# Patient Record
Sex: Male | Born: 1995 | Race: Black or African American | Hispanic: No | Marital: Single
Health system: Southern US, Community
[De-identification: ages and names within clinical notes are randomized; demographics above are authoritative.]

## PROBLEM LIST (undated history)

## (undated) DIAGNOSIS — B029 Zoster without complications: Secondary | ICD-10-CM

## (undated) HISTORY — DX: Zoster without complications: B02.9

---

## 2018-10-01 ENCOUNTER — Emergency Department (HOSPITAL_COMMUNITY)
Admission: EM | Admit: 2018-10-01 | Discharge: 2018-10-01 | Disposition: A | Discharge status: Home / Self Care | Payer: BC Managed Care – PPO | Attending: Emergency Medicine | Admitting: Emergency Medicine

## 2018-10-01 ENCOUNTER — Encounter (HOSPITAL_COMMUNITY): Payer: Self-pay | Admitting: Emergency Medicine

## 2018-10-01 ENCOUNTER — Other Ambulatory Visit: Payer: Self-pay

## 2018-10-01 DIAGNOSIS — B029 Zoster without complications: Secondary | ICD-10-CM | POA: Diagnosis not present

## 2018-10-01 DIAGNOSIS — R21 Rash and other nonspecific skin eruption: Secondary | ICD-10-CM | POA: Diagnosis present

## 2018-10-01 MED ORDER — ACYCLOVIR 200 MG PO CAPS
800.0000 mg | ORAL_CAPSULE | Freq: Once | ORAL | Status: AC
  Administered 2018-10-01: 22:00:00 800 mg via ORAL
  Filled 2018-10-01: qty 4

## 2018-10-01 MED ORDER — ACYCLOVIR 800 MG PO TABS: 800.0000 mg | ORAL_TABLET | Freq: Every day | ORAL | 0 refills | Status: AC

## 2018-10-01 NOTE — ED Triage Notes (Addendum)
Patient sent from [place] where he was evaluated for rash on left neck x4 days.

## 2018-10-01 NOTE — ED Provider Notes (Signed)
[facility]-EMERGENCY DEPT Provider Note   CSN: [id number] Arrival date & time: 10/01/18  1655     History   Chief Complaint Chief Complaint  Patient presents with  . Rash    HPI Tony Ford is a 23 y.o. male.   States that he noticed rash on his neck starting approximately 3 days ago.  States it has slowly been progressing, now spreading to the left side of his face.  States has not noticed anything on the right side of his neck or right side of his face.  Went to urgent care where he was told likely has shingles but should go to the emergency department for further evaluation.  Patient denies any vision changes, hearing changes, taste changes.  No fevers, sick contacts, cough.  States rash is painful, no significant itching.  Denies headaches, neck pain, neck stiffness.  Denies any known medical problems.     HPI  History reviewed. No pertinent past medical history.  There are no active problems to display for this patient.   History reviewed. No pertinent surgical history.      Home Medications    Prior to Admission medications   Medication Sig Start Date End Date Taking? Authorizing Provider  acyclovir (ZOVIRAX) 800 MG tablet Take 1 tablet (800 mg total) by mouth 5 (five) times daily for 7 days. 10/01/18 10/08/18  Milagros Lollykstra, Gearald Stonebraker S, MD    Family History No family history on file.  Social History Social History   Tobacco Use  . Smoking status: Not on file  Substance Use Topics  . Alcohol use: Not on file  . Drug use: Not on file     Allergies   Patient has no known allergies.   Review of Systems Review of Systems  Constitutional: Negative for chills and fever.  HENT: Negative for ear pain and sore throat.   Eyes: Negative for pain and visual disturbance.  Respiratory: Negative for cough and shortness of breath.   Cardiovascular: Negative for chest pain and palpitations.  Gastrointestinal: Negative for abdominal pain and  vomiting.  Genitourinary: Negative for dysuria and hematuria.  Musculoskeletal: Negative for arthralgias and back pain.  Skin: Positive for rash. Negative for color change.  Neurological: Negative for seizures and syncope.  All other systems reviewed and are negative.    Physical Exam Updated Vital Signs BP (!) 136/94   Pulse (!) 53   Temp 99.9 F (37.7 C)   Resp 18   SpO2 99%   Physical Exam Vitals signs and nursing note reviewed.  Constitutional:      Appearance: He is well-developed.  HENT:     Head: Normocephalic and atraumatic.  Eyes:     Conjunctiva/sclera: Conjunctivae normal.  Neck:     Musculoskeletal: Neck supple.  Cardiovascular:     Rate and Rhythm: Normal rate and regular rhythm.     Heart sounds: No murmur.  Pulmonary:     Effort: Pulmonary effort is normal. No respiratory distress.     Breath sounds: Normal breath sounds.  Abdominal:     Palpations: Abdomen is soft.     Tenderness: There is no abdominal tenderness.  Skin:    Comments: Scattered blistering rash across left side of neck, extending to left jaw, base of left ear; does not involve mouth, nose, eye; ear is clear without lesions  Neurological:     Mental Status: He is alert.      ED Treatments / Results  Labs (all labs ordered are  listed, but only abnormal results are displayed) Labs Reviewed  NOVEL CORONAVIRUS, NAA (HOSPITAL ORDER, SEND-OUT TO REF LAB)    EKG None  Radiology No results found.  Procedures Procedures (including critical care time)  Medications Ordered in ED Medications  acyclovir (ZOVIRAX) 200 MG capsule 800 mg (800 mg Oral Given 10/01/18 2142)     Initial Impression / Assessment and Plan / ED Course  I have reviewed the triage vital signs and the nursing notes.  Pertinent labs & imaging results that were available during my care of the patient were reviewed by me and considered in my medical decision making (see chart for details).        23 year old  gentleman presents with rash, concern for shingles based on appearance, unilateral nature.  Will start on acyclovir.  Does not involve mouth, nose, eye, does extend to the base of the ear but did not see any lesions on inside of ear.  Reviewed strict return precautions and recommended recheck here in 24 hours.  Final Clinical Impressions(s) / ED Diagnoses   Final diagnoses:  Herpes zoster without complication    ED Discharge Orders         Ordered    acyclovir (ZOVIRAX) 800 MG tablet  5 times daily     10/01/18 2112           Lucrezia Starch, MD 10/01/18 2359

## 2018-10-01 NOTE — Discharge Instructions (Addendum)
Please return to the emergency department for recheck tomorrow to monitor your rash.  If the rash is worsening, particularly if it has any involvement over your eye, mouth, please return here for reassessment sooner.  Please take the medicine as prescribed for this.

## 2019-05-18 ENCOUNTER — Ambulatory Visit: Payer: Self-pay | Admitting: Adult Health Nurse Practitioner

## 2019-05-19 ENCOUNTER — Encounter: Payer: Self-pay | Admitting: Adult Health Nurse Practitioner

## 2019-05-25 ENCOUNTER — Ambulatory Visit (INDEPENDENT_AMBULATORY_CARE_PROVIDER_SITE_OTHER): Payer: 59 | Admitting: Adult Health Nurse Practitioner

## 2019-05-25 ENCOUNTER — Other Ambulatory Visit: Payer: Self-pay

## 2019-05-25 VITALS — BP 121/74 | HR 82 | Temp 98.7°F | Ht 75.0 in | Wt 135.2 lb

## 2019-05-25 DIAGNOSIS — Z23 Encounter for immunization: Secondary | ICD-10-CM

## 2019-05-25 DIAGNOSIS — R7309 Other abnormal glucose: Secondary | ICD-10-CM

## 2019-05-25 DIAGNOSIS — E43 Unspecified severe protein-calorie malnutrition: Secondary | ICD-10-CM | POA: Diagnosis not present

## 2019-05-25 DIAGNOSIS — Z114 Encounter for screening for human immunodeficiency virus [HIV]: Secondary | ICD-10-CM

## 2019-05-25 DIAGNOSIS — Z0000 Encounter for general adult medical examination without abnormal findings: Secondary | ICD-10-CM

## 2019-05-25 LAB — POCT URINALYSIS DIP (MANUAL ENTRY)
Bilirubin, UA: NEGATIVE
Blood, UA: NEGATIVE
Glucose, UA: NEGATIVE mg/dL
Ketones, POC UA: NEGATIVE mg/dL
Leukocytes, UA: NEGATIVE
Nitrite, UA: NEGATIVE
Protein Ur, POC: NEGATIVE mg/dL
Spec Grav, UA: <=1.005 — AB (ref 1.010–1.025)
Urobilinogen, UA: 0.2 E.U./dL
pH, UA: 5 (ref 5.0–8.0)

## 2019-05-25 LAB — GLUCOSE, POCT (MANUAL RESULT ENTRY): POC Glucose: 85 mg/dl (ref 70–99)

## 2019-05-25 MED ORDER — METAMUCIL 48.57 % PO POWD: 1.0000 | Freq: Every day | ORAL | 2 refills | Status: AC

## 2019-05-25 MED ORDER — METOCLOPRAMIDE HCL 5 MG PO TABS: 5.0000 mg | ORAL_TABLET | Freq: Three times a day (TID) | ORAL | 0 refills | Status: DC

## 2019-05-25 MED ORDER — TRAZODONE HCL 50 MG PO TABS: ORAL_TABLET | ORAL | 3 refills | Status: DC

## 2019-05-25 NOTE — Patient Instructions (Signed)
° ° ° °  If you have lab work done today you will be contacted with your lab results within the next 2 weeks.  If you have not heard from us then please contact us. The fastest way to get your results is to register for My Chart. ° ° °IF you received an x-ray today, you will receive an invoice from [facility]. Please contact [facility] at [phone] with questions or concerns regarding your invoice.  ° °IF you received labwork today, you will receive an invoice from [facility]. Please contact [facility] at [phone] with questions or concerns regarding your invoice.  ° °Our billing staff will not be able to assist you with questions regarding bills from these companies. ° °You will be contacted with the lab results as soon as they are available. The fastest way to get your results is to activate your My Chart account. Instructions are located on the last page of this paperwork. If you have not heard from us regarding the results in 2 weeks, please contact this office. °  ° ° ° °

## 2019-05-26 LAB — CMP14+EGFR
ALT: 17 IU/L (ref 0–44)
AST: 22 IU/L (ref 0–40)
Albumin/Globulin Ratio: 1.6 (ref 1.2–2.2)
Albumin: 4.9 g/dL (ref 4.1–5.2)
Alkaline Phosphatase: 139 IU/L — ABNORMAL HIGH (ref 39–117)
BUN/Creatinine Ratio: 11 (ref 9–20)
BUN: 9 mg/dL (ref 6–20)
Bilirubin Total: <0.2 mg/dL (ref 0.0–1.2)
CO2: 26 mmol/L (ref 20–29)
Calcium: 9.9 mg/dL (ref 8.7–10.2)
Chloride: 98 mmol/L (ref 96–106)
Creatinine, Ser: 0.8 mg/dL (ref 0.76–1.27)
GFR calc Af Amer: 144 mL/min/{1.73_m2} (ref >59)
GFR calc non Af Amer: 124 mL/min/{1.73_m2} (ref >59)
Globulin, Total: 3 g/dL (ref 1.5–4.5)
Glucose: 89 mg/dL (ref 65–99)
Potassium: 4.2 mmol/L (ref 3.5–5.2)
Sodium: 140 mmol/L (ref 134–144)
Total Protein: 7.9 g/dL (ref 6.0–8.5)

## 2019-05-26 LAB — THYROID PANEL WITH TSH
Free Thyroxine Index: 1.7 (ref 1.2–4.9)
T3 Uptake Ratio: 30 % (ref 24–39)
T4, Total: 5.7 ug/dL (ref 4.5–12.0)
TSH: 2.43 u[IU]/mL (ref 0.450–4.500)

## 2019-05-26 LAB — VITAMIN B12: Vitamin B-12: 1055 pg/mL (ref 232–1245)

## 2019-05-26 LAB — IRON,TIBC AND FERRITIN PANEL
Ferritin: 71 ng/mL (ref 30–400)
Iron Saturation: 19 % (ref 15–55)
Iron: 66 ug/dL (ref 38–169)
Total Iron Binding Capacity: 341 ug/dL (ref 250–450)
UIBC: 275 ug/dL (ref 111–343)

## 2019-05-26 LAB — VITAMIN D 25 HYDROXY (VIT D DEFICIENCY, FRACTURES): Vit D, 25-Hydroxy: 35.5 ng/mL (ref 30.0–100.0)

## 2019-05-26 LAB — HIV ANTIBODY (ROUTINE TESTING W REFLEX): HIV Screen 4th Generation wRfx: NONREACTIVE

## 2019-06-08 ENCOUNTER — Ambulatory Visit: Payer: 59 | Admitting: Adult Health Nurse Practitioner

## 2019-06-08 ENCOUNTER — Other Ambulatory Visit: Payer: Self-pay

## 2019-06-08 MED ORDER — PEDIALYTE SINGLES PO SOLN: 1.0000 | Freq: Two times a day (BID) | ORAL | 1 refills | Status: DC

## 2019-06-08 NOTE — Patient Instructions (Signed)
° ° ° °  If you have lab work done today you will be contacted with your lab results within the next 2 weeks.  If you have not heard from us then please contact us. The fastest way to get your results is to register for My Chart. ° ° °IF you received an x-ray today, you will receive an invoice from [facility]. Please contact  Radiology at [phone] with questions or concerns regarding your invoice.  ° °IF you received labwork today, you will receive an invoice from [facility]. Please contact [facility] at [phone] with questions or concerns regarding your invoice.  ° °Our billing staff will not be able to assist you with questions regarding bills from these companies. ° °You will be contacted with the lab results as soon as they are available. The fastest way to get your results is to activate your My Chart account. Instructions are located on the last page of this paperwork. If you have not heard from us regarding the results in 2 weeks, please contact this office. °  ° ° ° °

## 2019-06-08 NOTE — Progress Notes (Signed)
History   Chief Complaint  Patient presents with  . Establish Care    Pt stated that he has chills in both hand and feet, Lt eye has been shaking, low appetite, and tingling in his big toe.    HPI    Patient reports that over the past 2 months he has had chills in both his hands and his feet and has had some tingling in his left eye and in his big toe.  Upon discussion, patient states he only has time to eat 1 meal a day.  He is not receiving much nutrition at all.  If anything, he eats some rice at night and is able to consume about 4 glasses of water.  We discussed how he is likely not getting enough nutrition.  I suspect this is at the root of his problems.  We discussed the need for an interpreter to drink 8 or more glasses a day at least and to try to eat some small meals in between, with protein.  He simply does not have enough calories to sustain normal bodily function.  No past medical history on file.  No family history on file. Social History   Tobacco Use  . Smoking status: Not on file  Substance Use Topics  . Alcohol use: Not on file  . Drug use: Not on file    Review of Systems   Review of Systems  Constitutional: Negative for activity change, appetite change, chills and fever. Feels dehydrated, dry skin + dry eyes  HENT: Negative for congestion, nosebleeds, trouble swallowing and voice change.   Respiratory: Negative for cough, shortness of breath and wheezing.   Gastrointestinal: Negative for diarrhea, nausea and vomiting.  Genitourinary: Negative for difficulty urinating, dysuria, flank pain and hematuria.  Musculoskeletal: Negative for back pain, joint swelling and neck pain.  Neurological: Negative for dizziness, speech difficulty, light-headedness and numbness.  See HPI. All other review of systems negative.    Allergies   Patient has no known allergies.  Home Medications    Current Outpatient Medications:  .  metoCLOPramide (REGLAN) 5 MG tablet,  Take 1 tablet (5 mg total) by mouth 3 (three) times daily before meals for 10 days. 30 minutes before a full meal, should not exceed 3 doses in one day, Disp: 30 tablet, Rfl: 0 .  Oral Electrolytes (PEDIALYTE SINGLES) SOLN, Take 1 packet by mouth in the morning and at bedtime., Disp: 237 mL, Rfl: 1 .  Psyllium (METAMUCIL) 48.57 % POWD, Take 1 Scoop by mouth daily. (Patient not taking: Reported on 06/08/2019), Disp: 300 g, Rfl: 2 .  traZODone (DESYREL) 50 MG tablet, 1/2 to 1tab qhs 30 min prior to bed, may increase to 1 full tab if not effective (Patient not taking: Reported on 06/08/2019), Disp: 30 tablet, Rfl: 3  Meds Ordered and Administered this Visit   Meds ordered this encounter  Medications  . metoCLOPramide (REGLAN) 5 MG tablet    Sig: Take 1 tablet (5 mg total) by mouth 3 (three) times daily before meals for 10 days. 30 minutes before a full meal, should not exceed 3 doses in one day    Dispense:  30 tablet    Refill:  0  . Psyllium (METAMUCIL) 48.57 % POWD    Sig: Take 1 Scoop by mouth daily.    Dispense:  300 g    Refill:  2  . traZODone (DESYREL) 50 MG tablet    Sig: 1/2 to 1tab qhs 30 min prior to  bed, may increase to 1 full tab if not effective    Dispense:  30 tablet    Refill:  3  . Oral Electrolytes (PEDIALYTE SINGLES) SOLN    Sig: Take 1 packet by mouth in the morning and at bedtime.    Dispense:  237 mL    Refill:  1    BP 121/74 (BP Location: Right Arm, Patient Position: Sitting, Cuff Size: Normal)   Pulse 82   Temp 98.7 F (37.1 C) (Temporal)   Ht '6\' 3"'  (1.905 m)   Wt 135 lb 3.2 oz (61.3 kg)   SpO2 94%   BMI 16.90 kg/m   Physical Exam General appearance: alert, well appearing, and in no distress, oriented to person, place, and time and anxious. Chest: clear to auscultation, no wheezes, rales or rhonchi, symmetric air entry.  CVS exam: normal rate, regular rhythm, normal S1, S2, no murmurs, rubs, clicks or gallops. Skin exam - normal coloration and turgor, no  rashes, no suspicious skin lesions noted. Mental Status: normal mood, behavior, speech, dress, motor activity, and thought processes, anxious.   MDM   1. Healthcare maintenance   2. Screening for HIV (human immunodeficiency virus)   3. Need for Tdap vaccination   4. Other abnormal glucose   5. Protein-calorie malnutrition, severe ([place])    Orders Placed This Encounter  Procedures  . HIV antibody (with reflex)  . CMP14+EGFR  . VITAMIN D 25 Hydroxy (Vit-D Deficiency, Fractures)  . Iron, TIBC and Ferritin Panel  . Thyroid Panel With TSH  . Vitamin B12  . POCT urinalysis dipstick  . POCT glucose (manual entry)    Meds ordered this encounter  Medications  . metoCLOPramide (REGLAN) 5 MG tablet    Sig: Take 1 tablet (5 mg total) by mouth 3 (three) times daily before meals for 10 days. 30 minutes before a full meal, should not exceed 3 doses in one day    Dispense:  30 tablet    Refill:  0  . Psyllium (METAMUCIL) 48.57 % POWD    Sig: Take 1 Scoop by mouth daily.    Dispense:  300 g    Refill:  2  . traZODone (DESYREL) 50 MG tablet    Sig: 1/2 to 1tab qhs 30 min prior to bed, may increase to 1 full tab if not effective    Dispense:  30 tablet    Refill:  3  . DISCONTD: Oral Electrolytes (PEDIALYTE SINGLES) SOLN    Sig: Take 1 packet by mouth in the morning and at bedtime.    Dispense:  237 mL    Refill:  1

## 2019-07-07 DIAGNOSIS — Z23 Encounter for immunization: Secondary | ICD-10-CM | POA: Insufficient documentation

## 2019-07-07 DIAGNOSIS — R7309 Other abnormal glucose: Secondary | ICD-10-CM | POA: Insufficient documentation

## 2019-07-07 DIAGNOSIS — Z114 Encounter for screening for human immunodeficiency virus [HIV]: Secondary | ICD-10-CM | POA: Insufficient documentation

## 2019-07-07 DIAGNOSIS — Z0000 Encounter for general adult medical examination without abnormal findings: Secondary | ICD-10-CM | POA: Insufficient documentation

## 2019-07-07 DIAGNOSIS — E43 Unspecified severe protein-calorie malnutrition: Secondary | ICD-10-CM | POA: Insufficient documentation

## 2019-08-20 ENCOUNTER — Encounter: Payer: Self-pay | Admitting: Family Medicine

## 2019-08-20 ENCOUNTER — Other Ambulatory Visit: Payer: Self-pay

## 2019-08-20 ENCOUNTER — Ambulatory Visit (INDEPENDENT_AMBULATORY_CARE_PROVIDER_SITE_OTHER): Payer: 59 | Admitting: Family Medicine

## 2019-08-20 VITALS — BP 131/77 | HR 53 | Temp 98.4°F | Resp 15 | Ht 75.0 in | Wt 137.8 lb

## 2019-08-20 DIAGNOSIS — G8929 Other chronic pain: Secondary | ICD-10-CM | POA: Diagnosis not present

## 2019-08-20 DIAGNOSIS — R1030 Lower abdominal pain, unspecified: Secondary | ICD-10-CM | POA: Diagnosis not present

## 2019-08-20 DIAGNOSIS — M545 Low back pain: Secondary | ICD-10-CM

## 2019-08-20 DIAGNOSIS — R350 Frequency of micturition: Secondary | ICD-10-CM | POA: Diagnosis not present

## 2019-08-20 DIAGNOSIS — R5383 Other fatigue: Secondary | ICD-10-CM

## 2019-08-20 DIAGNOSIS — R1013 Epigastric pain: Secondary | ICD-10-CM

## 2019-08-20 LAB — POCT URINALYSIS DIP (MANUAL ENTRY)
Bilirubin, UA: NEGATIVE
Blood, UA: NEGATIVE
Glucose, UA: NEGATIVE mg/dL
Ketones, POC UA: NEGATIVE mg/dL
Leukocytes, UA: NEGATIVE
Nitrite, UA: NEGATIVE
Protein Ur, POC: NEGATIVE mg/dL
Spec Grav, UA: 1.015 (ref 1.010–1.025)
Urobilinogen, UA: 0.2 E.U./dL
pH, UA: 5 (ref 5.0–8.0)

## 2019-08-20 NOTE — Progress Notes (Signed)
Patient ID: Tony Ford, male    DOB: 04-27-1995  Age: 24 y.o. MRN: [id number]  Chief Complaint  Patient presents with  . Fatigue    pt complains of abdominal pain with fatigue, pt thinks this is due to his prostate, for the past year, pt states he is having pain in testicles, and back and is having trouble urinating,     Subjective:  24 year old man who has been in the [place] for 2 years from [place].  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  He has been having problems with excessive fatigue.  His biggest concern though is low abdomen hurts him.  He thinks it is from his prostate.  He has urinary frequency.  He aches in the back and bladder.  This been going on for a long time.  Previous urinalysis was normal.  He has had some dyspepsia.  No nausea or vomiting.  He eats okay.  Bowels act normally.  Does get up in the night to urinate.  Does not feel like he is under undue stress or anxiety.  Is anxious some.  Does not need to take the sleeping medication, trazodone, that he was given in the past.  No fevers.  Current allergies, medications, problem list, past/family and social histories reviewed.  Objective:  BP 131/77   Pulse (!) 53   Temp 98.4 F (36.9 C) (Temporal)   Resp 15   Ht '6\' 3"'  (1.905 m)   Wt 137 lb 12.8 oz (62.5 kg)   SpO2 100%   BMI 17.22 kg/m   Lean young man in no acute distress.  Chest clear.  Abdomen soft without masses or specific tenderness.  He hurts in his back but he is nontender in the back.  Good range of motion of the spine.  Normal male external genitalia with testes descended.  No hernias or pain.  Digital rectal exam reveals an exceptionally small prostate gland but no tenderness or nodules. Assessment & Plan:   Assessment: 1. Lower abdominal pain   2. Urinary frequency   3. Chronic midline low back pain without sciatica   4. Dyspepsia   5. Fatigue, unspecified type       Plan: See  instructions.  I spoke to him through an interpreter on the computer.  I am not certain what exactly is going on with him, but will start with some basic work-up.  He may need to see a urologist, though I do not find anything clearly that they will do something with.  Orders Placed This Encounter  Procedures  . US Abdomen Complete    Chronic low abdomen pain with urinary frequency and back pain    Standing Status:   Future    Standing Expiration Date:   08/19/2020    Order Specific Question:   Reason for Exam (SYMPTOM  OR DIAGNOSIS REQUIRED)    Answer:   low abdomen and bladder pain    Order Specific Question:   Preferred imaging location?    Answer:   [facility]  . CBC  . CMP14+EGFR  . Sedimentation rate  . POCT urinalysis dipstick    No orders of the defined types were placed in this encounter.        Patient Instructions     You have been having your symptoms for a long time.  Your prostate is normal on exam.  I am going ahead and  getting some laboratory tests on you.  These include blood tests, a urine check, and a scan of your abdomen.  I had told you I was going to do a CT scan, but I change my mind and have decided to do an ultrasound which shows the bladder and kidneys well.  Someone will contact you and let you know when this is scheduled for.  I realize you do not speak English very well, but you need to get a friend to help you go over these with you.       .     .      .       .                        .       .                    .  For your stomach and appetite you can try taking a little over-the-counter Prilosec  (omeprazole) 1 daily 20 mg and see if that helps you.          Prilosec () 1  20       .  If you have lab work done today you will be contacted with your lab results within the next 2 weeks.  If you have not heard from [place] then please contact us. The fastest way to get your results is to register for My Chart.   IF you received an x-ray today, you will receive an invoice from [facility]. Please contact [facility] at [phone] with questions or concerns regarding your invoice.   IF you received labwork today, you will receive an invoice from [facility]. Please contact [facility] at [phone] with questions or concerns regarding your invoice.   Our billing staff will not be able to assist you with questions regarding bills from these companies.  You will be contacted with the lab results as soon as they are available. The fastest way to get your results is to activate your My Chart account. Instructions are located on the last page of this paperwork. If you have not heard from [place] regarding the results in 2 weeks, please contact this office.        Return if symptoms worsen or fail to improve.   Ruben Reason, MD 08/20/2019

## 2019-08-20 NOTE — Patient Instructions (Addendum)
   You have been having your symptoms for a long time.  Your prostate is normal on exam.  I am going ahead and getting some laboratory tests on you.  These include blood tests, a urine check, and a scan of your abdomen.  I had told you I was going to do a CT scan, but I change my mind and have decided to do an ultrasound which shows the bladder and kidneys well.  Someone will contact you and let you know when this is scheduled for.  I realize you do not speak English very well, but you need to get a friend to help you go over these with you.       .     .      .       .                        .       .                    .  For your stomach and appetite you can try taking a little over-the-counter Prilosec (omeprazole) 1 daily 20 mg and see if that helps you.          Prilosec () 1  20       .  If you have lab work done today you will be contacted with your lab results within the next 2 weeks.  If you have not heard from [place] then please contact us. The fastest way to get your results is to register for My Chart.   IF you received an x-ray today, you will receive an invoice from [facility]. Please contact [facility] at [phone] with questions or concerns regarding your invoice.   IF you received labwork today, you will receive an invoice from [facility]. Please contact [facility] at [phone] with questions or concerns regarding your invoice.   Our billing staff will not be able to assist you with questions regarding bills from these  companies.  You will be contacted with the lab results as soon as they are available. The fastest way to get your results is to activate your My Chart account. Instructions are located on the last page of this paperwork. If you have not heard from [place] regarding the results in 2 weeks, please contact this office.

## 2019-08-21 LAB — CMP14+EGFR
ALT: 22 IU/L (ref 0–44)
AST: 22 IU/L (ref 0–40)
Albumin/Globulin Ratio: 1.8 (ref 1.2–2.2)
Albumin: 5.1 g/dL (ref 4.1–5.2)
Alkaline Phosphatase: 132 IU/L — ABNORMAL HIGH (ref 48–121)
BUN/Creatinine Ratio: 13 (ref 9–20)
BUN: 10 mg/dL (ref 6–20)
Bilirubin Total: 0.3 mg/dL (ref 0.0–1.2)
CO2: 28 mmol/L (ref 20–29)
Calcium: 10.9 mg/dL — ABNORMAL HIGH (ref 8.7–10.2)
Chloride: 102 mmol/L (ref 96–106)
Creatinine, Ser: 0.76 mg/dL (ref 0.76–1.27)
GFR calc Af Amer: 147 mL/min/{1.73_m2} (ref >59)
GFR calc non Af Amer: 127 mL/min/{1.73_m2} (ref >59)
Globulin, Total: 2.9 g/dL (ref 1.5–4.5)
Glucose: 106 mg/dL — ABNORMAL HIGH (ref 65–99)
Potassium: 4.9 mmol/L (ref 3.5–5.2)
Sodium: 142 mmol/L (ref 134–144)
Total Protein: 8 g/dL (ref 6.0–8.5)

## 2019-08-21 LAB — SEDIMENTATION RATE: Sed Rate: 4 mm/hr (ref 0–15)

## 2019-08-21 LAB — CBC
Hematocrit: 49.9 % (ref 37.5–51.0)
Hemoglobin: 16.6 g/dL (ref 13.0–17.7)
MCH: 30.8 pg (ref 26.6–33.0)
MCHC: 33.3 g/dL (ref 31.5–35.7)
MCV: 93 fL (ref 79–97)
Platelets: 270 10*3/uL (ref 150–450)
RBC: 5.39 x10E6/uL (ref 4.14–5.80)
RDW: 12.5 % (ref 11.6–15.4)
WBC: 3.7 10*3/uL (ref 3.4–10.8)

## 2019-08-31 ENCOUNTER — Ambulatory Visit
Admission: RE | Admit: 2019-08-31 | Discharge: 2019-08-31 | Disposition: A | Discharge status: Home / Self Care | Payer: 59 | Source: Ambulatory Visit | Attending: Family Medicine | Admitting: Family Medicine

## 2019-08-31 DIAGNOSIS — R350 Frequency of micturition: Secondary | ICD-10-CM

## 2019-08-31 DIAGNOSIS — R1030 Lower abdominal pain, unspecified: Secondary | ICD-10-CM

## 2019-08-31 DIAGNOSIS — M545 Low back pain, unspecified: Secondary | ICD-10-CM

## 2019-09-01 ENCOUNTER — Other Ambulatory Visit: Payer: BC Managed Care – PPO

## 2019-09-09 ENCOUNTER — Ambulatory Visit (INDEPENDENT_AMBULATORY_CARE_PROVIDER_SITE_OTHER): Payer: 59 | Admitting: Family Medicine

## 2019-09-09 ENCOUNTER — Other Ambulatory Visit: Payer: Self-pay

## 2019-09-09 ENCOUNTER — Encounter: Payer: Self-pay | Admitting: Family Medicine

## 2019-09-09 NOTE — Progress Notes (Signed)
Patient ID: Tony Ford, male    DOB: Dec 17, 1995  Age: 24 y.o. MRN: [id number]  Chief Complaint  Patient presents with  . Abdominal Pain    pt has had an [place] and has been felling okay since no concerns at this time     Subjective:   Here for follow-up of his labs.  Reviewed them with him. Current allergies, medications, problem list, past/family and social histories reviewed.  Objective:  BP 132/74   Pulse (!) 50   Temp 98.6 F (37 C) (Temporal)   Resp 15   Ht 6' 3" (1.905 m)   Wt 138 lb 12.8 oz (63 kg)   SpO2 100%   BMI 17.35 kg/m   No thyromegaly.  He felt something in the right side of the neck but I think it is just the hyoid bone.  Assessment & Plan:   Assessment: 1. Serum calcium elevated       Plan: See instructions  Orders Placed This Encounter  Procedures  . Calcium    No orders of the defined types were placed in this encounter.        Patient Instructions    The ultrasound was entirely normal.   CLINICAL DATA:  Lower abdominal pain and urinary frequency  EXAM: ABDOMEN ULTRASOUND COMPLETE  COMPARISON:  None.  FINDINGS: Gallbladder: No gallstones or wall thickening visualized. No sonographic Murphy sign noted by sonographer.  Common bile duct: Diameter: 2 mm  Liver: No focal lesion identified. Within normal limits in parenchymal echogenicity. Portal vein is patent on color Doppler imaging with normal direction of blood flow towards the liver.  IVC: No abnormality visualized.  Pancreas: Visualized portion unremarkable.  Spleen: Size and appearance within normal limits.  Right Kidney: Length: 11.4 cm. Echogenicity within normal limits. No mass or hydronephrosis visualized.  Left Kidney: Length: 11.1 cm. Echogenicity within normal limits. No mass or hydronephrosis visualized.  Abdominal aorta: No aneurysm visualized.  Other findings: None.  IMPRESSION: Normal examination   The blood work was also very  good, and the only thing I am a little bit concerned about is that your calcium level was high.  I am repeating that.  If it stays high there is another test we will need to do.  I encourage you to drink more water.     Ref Range & Units 2 wk ago 3 mo ago  Glucose 65 - 99 mg/dL 106High  89   BUN 6 - 20 mg/dL 10  9   Creatinine, Ser 0.76 - 1.27 mg/dL 0.76  0.80   GFR calc non Af Amer >59 mL/min/1.73 127  124   GFR calc Af Amer >59 mL/min/1.73 147  144   Comment: **[facility] currently reports eGFR in compliance with the current**   recommendations of the [employer]. [facility] will   update reporting as new guidelines are published from the NKF-ASN   Task force.   BUN/Creatinine Ratio 9 - _0 Sodium 134 - 144 mmol/L 142  140   Potassium 3.5 - 5.2 mmol/L 4.9  4.2   Chloride 96 - 106 mmol/L 102  98   CO2 20 - 29 mmol/L 28  26   Calcium 8.7 - 10.2 mg/dL 10.9High  9.9   Total Protein 6.0 - 8.5 g/dL 8.0  7.9   Albumin 4.1 - 5.2 g/dL 5.1  4.9   Globulin, Total 1.5 - 4.5 g/dL 2.9  3.0   Albumin/Globulin Ratio 1.2 - 2.2 1.8  1.6   Bilirubin Total 0.0 - 1.2 mg/dL 0.3  <0.2   Alkaline Phosphatase 48 - 121 IU/L 132High  139High R   AST 0 - 40 IU/L 22  22   ALT 0 - 44 IU/L 22  17     Ref Range & Units 2 wk ago  WBC 3.4 - 10.8 x10E3/uL 3.7   RBC 4.14 - 5.80 x10E6/uL 5.39   Hemoglobin 13.0 - 17.7 g/dL 16.6   Hematocrit 37.5 - 51.0 % 49.9   MCV 79 - 97 fL 93   MCH 26.6 - 33.0 pg 30.8   MCHC 31 - 35 g/dL 33.3   RDW 11.6 - 15.4 % 12.5   Platelets 150 - 450 x10E3/uL 270     If you have lab work done today you will be contacted with your lab results within the next 2 weeks.  If you have not heard from [place] then please contact us. The fastest way to get your results is to register for My Chart.   IF you received an x-ray today, you will receive an invoice from [facility]. Please contact [facility] at [phone] with questions or concerns regarding  your invoice.   IF you received labwork today, you will receive an invoice from [facility]. Please contact [facility] at [phone] with questions or concerns regarding your invoice.   Our billing staff will not be able to assist you with questions regarding bills from these companies.  You will be contacted with the lab results as soon as they are available. The fastest way to get your results is to activate your My Chart account. Instructions are located on the last page of this paperwork. If you have not heard from [place] regarding the results in 2 weeks, please contact this office.        No follow-ups on file.   Ruben Reason, MD 09/09/2019

## 2019-09-09 NOTE — Patient Instructions (Addendum)
The ultrasound was entirely normal.   CLINICAL DATA:  Lower abdominal pain and urinary frequency  EXAM: ABDOMEN ULTRASOUND COMPLETE  COMPARISON:  None.  FINDINGS: Gallbladder: No gallstones or wall thickening visualized. No sonographic Murphy sign noted by sonographer.  Common bile duct: Diameter: 2 mm  Liver: No focal lesion identified. Within normal limits in parenchymal echogenicity. Portal vein is patent on color Doppler imaging with normal direction of blood flow towards the liver.  IVC: No abnormality visualized.  Pancreas: Visualized portion unremarkable.  Spleen: Size and appearance within normal limits.  Right Kidney: Length: 11.4 cm. Echogenicity within normal limits. No mass or hydronephrosis visualized.  Left Kidney: Length: 11.1 cm. Echogenicity within normal limits. No mass or hydronephrosis visualized.  Abdominal aorta: No aneurysm visualized.  Other findings: None.  IMPRESSION: Normal examination   The blood work was also very good, and the only thing I am a little bit concerned about is that your calcium level was high.  I am repeating that.  If it stays high there is another test we will need to do.  I encourage you to drink more water.     Ref Range & Units 2 wk ago 3 mo ago  Glucose 65 - 99 mg/dL 106High  89   BUN 6 - 20 mg/dL 10  9   Creatinine, Ser 0.76 - 1.27 mg/dL 0.76  0.80   GFR calc non Af Amer >59 mL/min/1.73 127  124   GFR calc Af Amer >59 mL/min/1.73 147  144   Comment: **[facility] currently reports eGFR in compliance with the current**   recommendations of the [employer]. [facility] will   update reporting as new guidelines are published from the NK[employer]-ASN   Task force.   BUN/Creatinine Ratio 9 - '20 13  11   ' Sodium 134 - 144 mmol/L 142  140   Potassium 3.5 - 5.2 mmol/L 4.9  4.2   Chloride 96 - 106 mmol/L 102  98   CO2 20 - 29 mmol/L 28  26   Calcium 8.7 - 10.2 mg/dL 10.9High  9.9   Total Protein  6.0 - 8.5 g/dL 8.0  7.9   Albumin 4.1 - 5.2 g/dL 5.1  4.9   Globulin, Total 1.5 - 4.5 g/dL 2.9  3.0   Albumin/Globulin Ratio 1.2 - 2.2 1.8  1.6   Bilirubin Total 0.0 - 1.2 mg/dL 0.3  <0.2   Alkaline Phosphatase 48 - 121 IU/L 132High  139High R   AST 0 - 40 IU/L 22  22   ALT 0 - 44 IU/L 22  17     Ref Range & Units 2 wk ago  WBC 3.4 - 10.8 x10E3/uL 3.7   RBC 4.14 - 5.80 x10E6/uL 5.39   Hemoglobin 13.0 - 17.7 g/dL 16.6   Hematocrit 37.5 - 51.0 % 49.9   MCV 79 - 97 fL 93   MCH 26.6 - 33.0 pg 30.8   MCHC 31 - 35 g/dL 33.3   RDW 11.6 - 15.4 % 12.5   Platelets 150 - 450 x10E3/uL 270     If you have lab work done today you will be contacted with your lab results within the next 2 weeks.  If you have not heard from [place] then please contact us. The fastest way to get your results is to register for My Chart.   IF you received an x-ray today, you will receive an invoice from [facility]. Please contact [facility] at [phone] with questions or  concerns regarding your invoice.   IF you received labwork today, you will receive an invoice from [facility]. Please contact [facility] at [phone] with questions or concerns regarding your invoice.   Our billing staff will not be able to assist you with questions regarding bills from these companies.  You will be contacted with the lab results as soon as they are available. The fastest way to get your results is to activate your My Chart account. Instructions are located on the last page of this paperwork. If you have not heard from [place] regarding the results in 2 weeks, please contact this office.

## 2019-09-10 LAB — CALCIUM: Calcium: 10.1 mg/dL (ref 8.7–10.2)

## 2019-11-26 ENCOUNTER — Telehealth: Payer: Self-pay | Admitting: General Practice

## 2019-11-26 NOTE — Telephone Encounter (Signed)
LVMTCB to have pt sch an appt

## 2019-11-26 NOTE — Telephone Encounter (Signed)
Patient is calling to schedule appt. Please advise CB- [phone]

## 2019-12-15 ENCOUNTER — Encounter: Payer: Self-pay | Admitting: Registered Nurse

## 2019-12-15 ENCOUNTER — Ambulatory Visit (INDEPENDENT_AMBULATORY_CARE_PROVIDER_SITE_OTHER): Payer: 59 | Admitting: Registered Nurse

## 2019-12-15 ENCOUNTER — Other Ambulatory Visit: Payer: Self-pay

## 2019-12-15 DIAGNOSIS — E43 Unspecified severe protein-calorie malnutrition: Secondary | ICD-10-CM

## 2019-12-15 DIAGNOSIS — R5383 Other fatigue: Secondary | ICD-10-CM | POA: Diagnosis not present

## 2019-12-15 DIAGNOSIS — R748 Abnormal levels of other serum enzymes: Secondary | ICD-10-CM

## 2019-12-15 DIAGNOSIS — Z114 Encounter for screening for human immunodeficiency virus [HIV]: Secondary | ICD-10-CM

## 2019-12-15 DIAGNOSIS — R1030 Lower abdominal pain, unspecified: Secondary | ICD-10-CM | POA: Diagnosis not present

## 2019-12-15 DIAGNOSIS — B0229 Other postherpetic nervous system involvement: Secondary | ICD-10-CM

## 2019-12-15 MED ORDER — GABAPENTIN 100 MG PO CAPS: 100.0000 mg | ORAL_CAPSULE | Freq: Three times a day (TID) | ORAL | 3 refills | Status: AC

## 2019-12-15 NOTE — Progress Notes (Signed)
Established Patient Office Visit  Subjective:  Patient ID: Tony Ford, male    DOB: 01-16-1996  Age: 24 y.o. MRN: [id number]  CC:  Chief Complaint  Patient presents with  . Transitions Of Care    PAtient is here for TOC. Patient states that he feels like his whole body has been going numb and feeling cold for 3-4 months. Per patient it happens everyday and mostly when urinating or making a bowel movent. Also after eating he gets so fatigue and passes alot of gas.   video interpreters: [phone]  HPI Tony Ford presents for [facility] Seen [facility] recently  Had been worked up for current complaint: Feeling cold, numb. Mostly when passing urine or bowel movement No loc or dizziness After eating very fatigued and bloated, a lot of gas Some epigastric pain Worse when lying down No association with particular foods No association with exercise habits or stress  In the past [facility] had suspected malnutrition had played substantial role - per patient usually only one meal each day, he is unclear on whether or not the symptoms prevent him from eating / make him hesitant to eat or if this is a separate concern  Past Medical History:  Diagnosis Date  . Shingles     History reviewed. No pertinent surgical history.  History reviewed. No pertinent family history.  Social History   Socioeconomic History  . Marital status: Single    Spouse name: Not on file  . Number of children: Not on file  . Years of education: Not on file  . Highest education level: Not on file  Occupational History  . Not on file  Tobacco Use  . Smoking status: Never Smoker  . Smokeless tobacco: Never Used  Substance and Sexual Activity  . Alcohol use: Never  . Drug use: Never  . Sexual activity: Not Currently  Other Topics Concern  . Not on file  Social History Narrative  . Not on file   Social Determinants of Health   Financial Resource Strain: Not on file  Food Insecurity:  Not on file  Transportation Needs: Not on file  Physical Activity: Not on file  Stress: Not on file  Social Connections: Not on file  Intimate Partner Violence: Not on file    Outpatient Medications Prior to Visit  Medication Sig Dispense Refill  . metoCLOPramide (REGLAN) 5 MG tablet Take 1 tablet (5 mg total) by mouth 3 (three) times daily before meals for 10 days. 30 minutes before a full meal, should not exceed 3 doses in one day 30 tablet 0  . omeprazole (PRILOSEC) 20 MG capsule Take 20 mg by mouth daily. (Patient not taking: Reported on 12/15/2019)    . traZODone (DESYREL) 50 MG tablet 1/2 to 1tab qhs 30 min prior to bed, may increase to 1 full tab if not effective (Patient not taking: Reported on 09/09/2019) 30 tablet 3   No facility-administered medications prior to visit.    No Known Allergies  ROS Review of Systems  Constitutional: Positive for chills and fatigue.  HENT: Negative.   Eyes: Negative.   Respiratory: Negative.   Cardiovascular: Negative.   Gastrointestinal: Negative.   Genitourinary: Negative.   Musculoskeletal: Negative.   Skin: Negative.   Neurological: Negative.   Psychiatric/Behavioral: Negative.   All other systems reviewed and are negative.     Objective:    Physical Exam Constitutional:      General: He is not in acute distress.  Appearance: Normal appearance. He is normal weight. He is not ill-appearing, toxic-appearing or diaphoretic.  Cardiovascular:     Rate and Rhythm: Normal rate and regular rhythm.     Heart sounds: Normal heart sounds. No murmur heard. No friction rub. No gallop.   Pulmonary:     Effort: Pulmonary effort is normal. No respiratory distress.     Breath sounds: Normal breath sounds. No stridor. No wheezing, rhonchi or rales.  Chest:     Chest wall: No tenderness.  Abdominal:     General: Abdomen is flat. Bowel sounds are normal. There is no distension.     Palpations: Abdomen is soft. There is no mass.      Tenderness: There is abdominal tenderness (mild epigastric). There is no right CVA tenderness, left CVA tenderness, guarding or rebound.     Hernia: No hernia is present.  Neurological:     General: No focal deficit present.     Mental Status: He is alert and oriented to person, place, and time. Mental status is at baseline.  Psychiatric:        Mood and Affect: Mood normal.        Behavior: Behavior normal.        Thought Content: Thought content normal.        Judgment: Judgment normal.     BP 135/81   Pulse 62   Temp 98 F (36.7 C) (Temporal)   Resp 18   Ht 6\' 3"  (1.905 m)   Wt 136 lb (61.7 kg)   SpO2 100%   BMI 17.00 kg/m  Wt Readings from Last 3 Encounters:  01/10/20 134 lb 6.4 oz (61 kg)  12/15/19 136 lb (61.7 kg)  09/09/19 138 lb 12.8 oz (63 kg)     Health Maintenance Due  Topic Date Due  . COVID-19 Vaccine (1) Never done    There are no preventive care reminders to display for this patient.  Lab Results  Component Value Date   TSH 2.440 12/15/2019   Lab Results  Component Value Date   WBC 4.2 12/15/2019   HGB 16.1 12/15/2019   HCT 48.0 12/15/2019   MCV 91 12/15/2019   PLT 270 08/20/2019   Lab Results  Component Value Date   NA 139 12/15/2019   K 4.1 12/15/2019   CO2 27 12/15/2019   GLUCOSE 92 12/15/2019   BUN 9 12/15/2019   CREATININE 0.89 12/15/2019   BILITOT 0.4 12/15/2019   ALKPHOS 116 12/15/2019   AST 17 12/15/2019   ALT 13 12/15/2019   PROT 7.6 12/15/2019   ALBUMIN 5.0 12/15/2019   CALCIUM 10.2 12/15/2019   No results found for: CHOL No results found for: HDL No results found for: LDLCALC No results found for: TRIG No results found for: CHOLHDL Lab Results  Component Value Date   HGBA1C 5.4 12/15/2019      Assessment & Plan:   Problem List Items Addressed This Visit      Other   Screening for HIV (human immunodeficiency virus)   Relevant Orders   HIV antibody (with reflex) (Completed)   Protein-calorie malnutrition,  severe (HCC)   Relevant Orders   ANA w/Reflex (Completed)    Other Visit Diagnoses    Serum calcium elevated    -  Primary   Relevant Orders   ANA w/Reflex (Completed)   PTH, Intact and Calcium (Completed)   Lower abdominal pain       Relevant Orders   ANA w/Reflex (Completed)  C-reactive protein (Completed)   Sedimentation Rate (Completed)   H Pylori, IGM, IGG, IGA AB (Completed)   Fatigue, unspecified type       Relevant Orders   ANA w/Reflex (Completed)   CBC With Differential (Completed)   Thyroid Panel With TSH (Completed)   Vitamin D, 25-hydroxy (Completed)   Vitamin B12 (Completed)   Hemoglobin A1c (Completed)   C-reactive protein (Completed)   Sedimentation Rate (Completed)   Urinalysis, Routine w reflex microscopic   B. Burgdorfi Antibodies (Completed)   Elevated liver enzymes       Relevant Orders   ANA w/Reflex (Completed)   Comprehensive metabolic panel (Completed)   Hepatitis panel, acute (Completed)   Post herpetic neuralgia       Relevant Medications   gabapentin (NEURONTIN) 100 MG capsule      Meds ordered this encounter  Medications  . gabapentin (NEURONTIN) 100 MG capsule    Sig: Take 1 capsule (100 mg total) by mouth 3 (three) times daily.    Dispense:  90 capsule    Refill:  3    Order Specific Question:   Supervising Provider    Answer:   Neva Seat, JEFFREY R [[id number]]    Follow-up: No follow-ups on file.   PLAN  Labs collected. Will follow up with the patient as warranted.  Likely an aspect of malnutrition but want to ensure there are no chronic GI issues happening contributing to a malabsorption  Gabapentin 100mg  PO tid for relief in case of neuro issue -   Close follow up   Patient encouraged to call clinic with any questions, comments, or concerns.  , NP

## 2019-12-15 NOTE — Patient Instructions (Signed)
° ° ° °  If you have lab work done today you will be contacted with your lab results within the next 2 weeks.  If you have not heard from us then please contact us. The fastest way to get your results is to register for My Chart. ° ° °IF you received an x-ray today, you will receive an invoice from [facility]. Please contact [facility] at [phone] with questions or concerns regarding your invoice.  ° °IF you received labwork today, you will receive an invoice from [facility]. Please contact [facility] at [phone] with questions or concerns regarding your invoice.  ° °Our billing staff will not be able to assist you with questions regarding bills from these companies. ° °You will be contacted with the lab results as soon as they are available. The fastest way to get your results is to activate your My Chart account. Instructions are located on the last page of this paperwork. If you have not heard from us regarding the results in 2 weeks, please contact this office. °  ° ° ° °

## 2019-12-17 LAB — CBC WITH DIFFERENTIAL
Basophils Absolute: 0.1 10*3/uL (ref 0.0–0.2)
Basos: 1 %
EOS (ABSOLUTE): 0.2 10*3/uL (ref 0.0–0.4)
Eos: 5 %
Hematocrit: 48 % (ref 37.5–51.0)
Hemoglobin: 16.1 g/dL (ref 13.0–17.7)
Immature Grans (Abs): 0 10*3/uL (ref 0.0–0.1)
Immature Granulocytes: 0 %
Lymphocytes Absolute: 2.2 10*3/uL (ref 0.7–3.1)
Lymphs: 52 %
MCH: 30.6 pg (ref 26.6–33.0)
MCHC: 33.5 g/dL (ref 31.5–35.7)
MCV: 91 fL (ref 79–97)
Monocytes Absolute: 0.5 10*3/uL (ref 0.1–0.9)
Monocytes: 12 %
Neutrophils Absolute: 1.3 10*3/uL — ABNORMAL LOW (ref 1.4–7.0)
Neutrophils: 30 %
RBC: 5.26 x10E6/uL (ref 4.14–5.80)
RDW: 12.7 % (ref 11.6–15.4)
WBC: 4.2 10*3/uL (ref 3.4–10.8)

## 2019-12-17 LAB — COMPREHENSIVE METABOLIC PANEL
ALT: 13 IU/L (ref 0–44)
AST: 17 IU/L (ref 0–40)
Albumin/Globulin Ratio: 1.9 (ref 1.2–2.2)
Albumin: 5 g/dL (ref 4.1–5.2)
Alkaline Phosphatase: 116 IU/L (ref 44–121)
BUN/Creatinine Ratio: 10 (ref 9–20)
BUN: 9 mg/dL (ref 6–20)
Bilirubin Total: 0.4 mg/dL (ref 0.0–1.2)
CO2: 27 mmol/L (ref 20–29)
Calcium: 10.2 mg/dL (ref 8.7–10.2)
Chloride: 98 mmol/L (ref 96–106)
Creatinine, Ser: 0.89 mg/dL (ref 0.76–1.27)
GFR calc Af Amer: 136 mL/min/{1.73_m2} (ref >59)
GFR calc non Af Amer: 118 mL/min/{1.73_m2} (ref >59)
Globulin, Total: 2.6 g/dL (ref 1.5–4.5)
Glucose: 92 mg/dL (ref 65–99)
Potassium: 4.1 mmol/L (ref 3.5–5.2)
Sodium: 139 mmol/L (ref 134–144)
Total Protein: 7.6 g/dL (ref 6.0–8.5)

## 2019-12-17 LAB — PTH, INTACT AND CALCIUM: PTH: 18 pg/mL (ref 15–65)

## 2019-12-17 LAB — ANA W/REFLEX: Anti Nuclear Antibody (ANA): NEGATIVE

## 2019-12-17 LAB — THYROID PANEL WITH TSH
Free Thyroxine Index: 1.7 (ref 1.2–4.9)
T3 Uptake Ratio: 29 % (ref 24–39)
T4, Total: 5.8 ug/dL (ref 4.5–12.0)
TSH: 2.44 u[IU]/mL (ref 0.450–4.500)

## 2019-12-17 LAB — SEDIMENTATION RATE: Sed Rate: 2 mm/hr (ref 0–15)

## 2019-12-17 LAB — HEPATITIS PANEL, ACUTE
Hep A IgM: NEGATIVE
Hep B C IgM: NEGATIVE
Hep C Virus Ab: <0.1 s/co ratio (ref 0.0–0.9)
Hepatitis B Surface Ag: NEGATIVE

## 2019-12-17 LAB — VITAMIN D 25 HYDROXY (VIT D DEFICIENCY, FRACTURES): Vit D, 25-Hydroxy: 29 ng/mL — ABNORMAL LOW (ref 30.0–100.0)

## 2019-12-17 LAB — H PYLORI, IGM, IGG, IGA AB
H pylori, IgM Abs: <9 units (ref 0.0–8.9)
H. pylori, IgA Abs: 20.2 units — ABNORMAL HIGH (ref 0.0–8.9)
H. pylori, IgG AbS: 2.8 Index Value — ABNORMAL HIGH (ref 0.00–0.79)

## 2019-12-17 LAB — C-REACTIVE PROTEIN: CRP: <1 mg/L (ref 0–10)

## 2019-12-17 LAB — HEMOGLOBIN A1C
Est. average glucose Bld gHb Est-mCnc: 108 mg/dL
Hgb A1c MFr Bld: 5.4 % (ref 4.8–5.6)

## 2019-12-17 LAB — VITAMIN B12: Vitamin B-12: 1195 pg/mL (ref 232–1245)

## 2019-12-17 LAB — HIV ANTIBODY (ROUTINE TESTING W REFLEX): HIV Screen 4th Generation wRfx: NONREACTIVE

## 2019-12-17 LAB — B. BURGDORFI ANTIBODIES: Lyme IgG/IgM Ab: <0.91 {ISR} (ref 0.00–0.90)

## 2019-12-20 ENCOUNTER — Other Ambulatory Visit: Payer: Self-pay | Admitting: Registered Nurse

## 2019-12-20 DIAGNOSIS — A048 Other specified bacterial intestinal infections: Secondary | ICD-10-CM

## 2019-12-20 MED ORDER — AMOXICILLIN 500 MG PO CAPS: 1000.0000 mg | ORAL_CAPSULE | Freq: Two times a day (BID) | ORAL | 0 refills | Status: DC

## 2019-12-20 MED ORDER — CLARITHROMYCIN 500 MG PO TABS: 500.0000 mg | ORAL_TABLET | Freq: Two times a day (BID) | ORAL | 0 refills | Status: DC

## 2019-12-20 MED ORDER — PANTOPRAZOLE SODIUM 40 MG PO TBEC: 40.0000 mg | DELAYED_RELEASE_TABLET | Freq: Every day | ORAL | 3 refills | Status: AC

## 2019-12-20 NOTE — Progress Notes (Signed)
If we could call patient - his serology shows positive for H. Pylori. We need to treat as follows: Clarithromycin 500mg  PO bid for 14 days Amoxicillin 1g PO bid for 14 days Pantoprazole 40mg  PO qd for 14 days  We can see him in about 3-4 weeks to check on his symptoms  Thank you  , NP

## 2020-01-10 ENCOUNTER — Ambulatory Visit (INDEPENDENT_AMBULATORY_CARE_PROVIDER_SITE_OTHER): Payer: 59

## 2020-01-10 ENCOUNTER — Other Ambulatory Visit: Payer: Self-pay

## 2020-01-10 ENCOUNTER — Ambulatory Visit (INDEPENDENT_AMBULATORY_CARE_PROVIDER_SITE_OTHER): Payer: 59 | Admitting: Registered Nurse

## 2020-01-10 ENCOUNTER — Encounter: Payer: Self-pay | Admitting: Registered Nurse

## 2020-01-10 VITALS — BP 127/80 | HR 54 | Temp 97.7°F | Ht 74.0 in | Wt 134.4 lb

## 2020-01-10 DIAGNOSIS — M25551 Pain in right hip: Secondary | ICD-10-CM

## 2020-01-10 DIAGNOSIS — M545 Low back pain, unspecified: Secondary | ICD-10-CM | POA: Diagnosis not present

## 2020-01-10 DIAGNOSIS — A048 Other specified bacterial intestinal infections: Secondary | ICD-10-CM

## 2020-01-10 DIAGNOSIS — M25552 Pain in left hip: Secondary | ICD-10-CM

## 2020-01-10 DIAGNOSIS — E43 Unspecified severe protein-calorie malnutrition: Secondary | ICD-10-CM

## 2020-01-10 DIAGNOSIS — R1030 Lower abdominal pain, unspecified: Secondary | ICD-10-CM

## 2020-01-10 DIAGNOSIS — R3915 Urgency of urination: Secondary | ICD-10-CM

## 2020-01-10 DIAGNOSIS — R634 Abnormal weight loss: Secondary | ICD-10-CM

## 2020-01-10 LAB — URINALYSIS, ROUTINE W REFLEX MICROSCOPIC
Bilirubin, UA: NEGATIVE
Glucose, UA: NEGATIVE
Ketones, UA: NEGATIVE
Leukocytes,UA: NEGATIVE
Nitrite, UA: NEGATIVE
Protein,UA: NEGATIVE
RBC, UA: NEGATIVE
Specific Gravity, UA: 1.015 (ref 1.005–1.030)
Urobilinogen, Ur: 0.2 mg/dL (ref 0.2–1.0)
pH, UA: 6 (ref 5.0–7.5)

## 2020-01-10 NOTE — Patient Instructions (Signed)
° ° ° °  If you have lab work done today you will be contacted with your lab results within the next 2 weeks.  If you have not heard from us then please contact us. The fastest way to get your results is to register for My Chart. ° ° °IF you received an x-ray today, you will receive an invoice from [facility]. Please contact [facility] at [phone] with questions or concerns regarding your invoice.  ° °IF you received labwork today, you will receive an invoice from [facility]. Please contact [facility] at [phone] with questions or concerns regarding your invoice.  ° °Our billing staff will not be able to assist you with questions regarding bills from these companies. ° °You will be contacted with the lab results as soon as they are available. The fastest way to get your results is to activate your My Chart account. Instructions are located on the last page of this paperwork. If you have not heard from us regarding the results in 2 weeks, please contact this office. °  ° ° ° °

## 2020-01-10 NOTE — Progress Notes (Signed)
Established Patient Office Visit  Subjective:  Patient ID: Tony Ford, male    DOB: January 16, 1996  Age: 24 y.o. MRN: [id number]  CC:  Chief Complaint  Patient presents with  . upset stomach   . Back Pain    lower area. all going on a year     HPI Tony Ford presents for follow up   Has ongoing symptoms of nausea, lower back pain, hip pain R>L, and coldness in hands and feet. Today reports that he has been experiencing urinary and fecal urgency nearing the point of incontinence for some time as well.  He denies diarrhea and vomiting. No recent change to diet or exercise. Does note that exercise worsens his symptoms of nausea and pain Pain does not wake him at night Does have some nocturia Sleep is not restful - has been told he snores but denies waking with headache or dry mouth, waking up gasping, and witnessed episodes of apnea.  Reports diet is average Pain feels msk through hip wrapping around to back Firmly denies hx of acute injury  Past Medical History:  Diagnosis Date  . Shingles     No past surgical history on file.  No family history on file.  Social History   Socioeconomic History  . Marital status: Single    Spouse name: Not on file  . Number of children: Not on file  . Years of education: Not on file  . Highest education level: Not on file  Occupational History  . Not on file  Tobacco Use  . Smoking status: Never Smoker  . Smokeless tobacco: Never Used  Substance and Sexual Activity  . Alcohol use: Never  . Drug use: Never  . Sexual activity: Not Currently  Other Topics Concern  . Not on file  Social History Narrative  . Not on file   Social Determinants of Health   Financial Resource Strain:   . Difficulty of Paying Living Expenses: Not on file  Food Insecurity:   . Worried About Programme researcher, broadcasting/film/video in the Last Year: Not on file  . Ran Out of Food in the Last Year: Not on file  Transportation Needs:   . Lack of  Transportation (Medical): Not on file  . Lack of Transportation (Non-Medical): Not on file  Physical Activity:   . Days of Exercise per Week: Not on file  . Minutes of Exercise per Session: Not on file  Stress:   . Feeling of Stress : Not on file  Social Connections:   . Frequency of Communication with Friends and Family: Not on file  . Frequency of Social Gatherings with Friends and Family: Not on file  . Attends Religious Services: Not on file  . Active Member of Clubs or Organizations: Not on file  . Attends Banker Meetings: Not on file  . Marital Status: Not on file  Intimate Partner Violence:   . Fear of Current or Ex-Partner: Not on file  . Emotionally Abused: Not on file  . Physically Abused: Not on file  . Sexually Abused: Not on file    Outpatient Medications Prior to Visit  Medication Sig Dispense Refill  . gabapentin (NEURONTIN) 100 MG capsule Take 1 capsule (100 mg total) by mouth 3 (three) times daily. 90 capsule 3  . pantoprazole (PROTONIX) 40 MG tablet Take 1 tablet (40 mg total) by mouth daily. 30 tablet 3  . amoxicillin (AMOXIL) 500 MG capsule Take 2 capsules (1,000 mg total) by mouth 2 (  two) times daily. 56 capsule 0  . clarithromycin (BIAXIN) 500 MG tablet Take 1 tablet (500 mg total) by mouth 2 (two) times daily. 28 tablet 0  . metoCLOPramide (REGLAN) 5 MG tablet Take 1 tablet (5 mg total) by mouth 3 (three) times daily before meals for 10 days. 30 minutes before a full meal, should not exceed 3 doses in one day 30 tablet 0  . omeprazole (PRILOSEC) 20 MG capsule Take 20 mg by mouth daily. (Patient not taking: Reported on 12/15/2019)    . traZODone (DESYREL) 50 MG tablet 1/2 to 1tab qhs 30 min prior to bed, may increase to 1 full tab if not effective (Patient not taking: Reported on 09/09/2019) 30 tablet 3   No facility-administered medications prior to visit.    No Known Allergies  ROS Review of Systems Per hpi     Objective:    Physical  Exam Constitutional:      General: He is not in acute distress.    Appearance: Normal appearance. He is normal weight. He is not ill-appearing, toxic-appearing or diaphoretic.  HENT:     Head: Normocephalic and atraumatic.  Cardiovascular:     Rate and Rhythm: Normal rate and regular rhythm.     Heart sounds: Normal heart sounds. No murmur heard.  No friction rub. No gallop.   Pulmonary:     Effort: Pulmonary effort is normal. No respiratory distress.     Breath sounds: Normal breath sounds. No stridor. No wheezing, rhonchi or rales.  Chest:     Chest wall: No tenderness.  Abdominal:     General: Abdomen is flat. Bowel sounds are normal.     Palpations: Abdomen is soft.  Musculoskeletal:        General: No swelling, tenderness, deformity or signs of injury. Normal range of motion.     Cervical back: Normal range of motion.     Right lower leg: No edema.     Left lower leg: No edema.  Skin:    General: Skin is warm and dry.     Capillary Refill: Capillary refill takes less than 2 seconds.     Comments: ? Of hyperpigmentation on palm of hand and fingers. Pt unsure of baseline.   Neurological:     General: No focal deficit present.     Mental Status: He is alert and oriented to person, place, and time. Mental status is at baseline.     Cranial Nerves: No cranial nerve deficit.     Sensory: No sensory deficit.     Motor: No weakness.  Psychiatric:        Mood and Affect: Mood normal.        Behavior: Behavior normal.        Thought Content: Thought content normal.        Judgment: Judgment normal.     BP 127/80   Pulse (!) 54   Temp 97.7 F (36.5 C) (Temporal)   Ht 6\' 2"  (1.88 m)   Wt 134 lb 6.4 oz (61 kg)   SpO2 99%   BMI 17.26 kg/m  Wt Readings from Last 3 Encounters:  01/10/20 134 lb 6.4 oz (61 kg)  12/15/19 136 lb (61.7 kg)  09/09/19 138 lb 12.8 oz (63 kg)     Health Maintenance Due  Topic Date Due  . COVID-19 Vaccine (1) Never done    There are no  preventive care reminders to display for this patient.  Lab Results  Component Value Date  TSH 2.440 12/15/2019   Lab Results  Component Value Date   WBC 4.2 12/15/2019   HGB 16.1 12/15/2019   HCT 48.0 12/15/2019   MCV 91 12/15/2019   PLT 270 08/20/2019   Lab Results  Component Value Date   NA 139 12/15/2019   K 4.1 12/15/2019   CO2 27 12/15/2019   GLUCOSE 92 12/15/2019   BUN 9 12/15/2019   CREATININE 0.89 12/15/2019   BILITOT 0.4 12/15/2019   ALKPHOS 116 12/15/2019   AST 17 12/15/2019   ALT 13 12/15/2019   PROT 7.6 12/15/2019   ALBUMIN 5.0 12/15/2019   CALCIUM 10.2 12/15/2019   No results found for: CHOL No results found for: HDL No results found for: LDLCALC No results found for: TRIG No results found for: CHOLHDL Lab Results  Component Value Date   HGBA1C 5.4 12/15/2019      Assessment & Plan:   Problem List Items Addressed This Visit      Other   Protein-calorie malnutrition, severe (HCC)   Relevant Orders   Ambulatory referral to Gastroenterology    Other Visit Diagnoses    Acute pain of both hips    -  Primary   Relevant Orders   DG Hip Unilat W OR W/O Pelvis 2-3 Views Right (Completed)   DG Hip Unilat W OR W/O Pelvis 2-3 Views Left (Completed)   Acute bilateral low back pain without sciatica       Relevant Orders   DG Lumbar Spine Complete (Completed)   Urine Culture   Urinalysis, Routine w reflex microscopic   Urinary urgency       Relevant Orders   Urine Culture   Urinalysis, Routine w reflex microscopic   Unexplained weight loss       H. pylori infection       Relevant Orders   Ambulatory referral to Gastroenterology   Lower abdominal pain       Relevant Orders   Ambulatory referral to Gastroenterology      No orders of the defined types were placed in this encounter.   Follow-up: No follow-ups on file.   PLAN  Concern for anxiety vs Gi process.  DG lumbar spine and hips wnl - reassuring for msk and potential neuro  etiology  My concern is for an IBS / IBD that is contributing to constipation and limiting PO intake leading to a nutrient deficiency.  Patient encouraged to call clinic with any questions, comments, or concerns.  I spent 55 minutes with this patient, more than 50% of which was spent counseling and/or educating.  Janeece Agee, NP

## 2020-01-11 LAB — URINE CULTURE

## 2020-01-18 ENCOUNTER — Encounter: Payer: Self-pay | Admitting: Radiology

## 2020-01-31 ENCOUNTER — Encounter: Payer: 59 | Admitting: Registered Nurse

## 2020-02-01 ENCOUNTER — Encounter: Payer: Self-pay | Admitting: Gastroenterology

## 2020-03-02 ENCOUNTER — Encounter: Payer: Self-pay | Admitting: Registered Nurse

## 2020-03-15 ENCOUNTER — Ambulatory Visit: Payer: 59 | Admitting: Gastroenterology

## 2020-12-06 IMAGING — US US ABDOMEN COMPLETE
1 series · 14 of 25 positions shown · non-contrast
Comparison: None.

CLINICAL DATA: Lower abdominal pain and urinary frequency

EXAM:
ABDOMEN ULTRASOUND COMPLETE

[Series 1: us abdomen complete · 0.17mm/px · 14 of 93 slices shown]
[im 1/93]
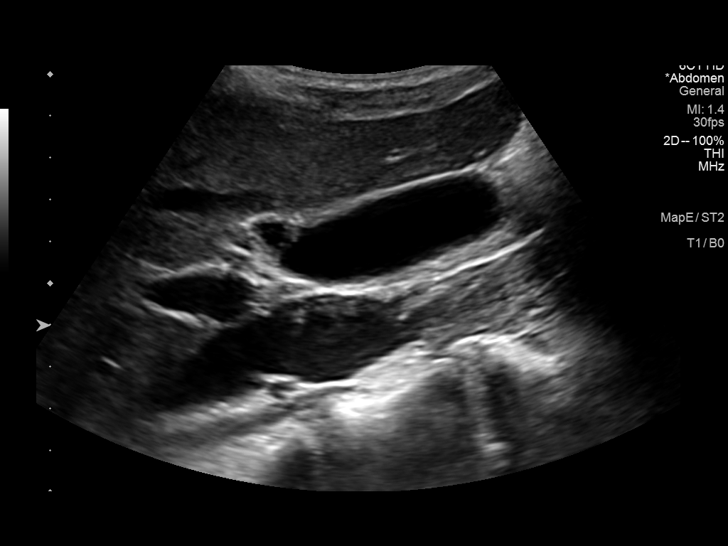
[im 8/93]
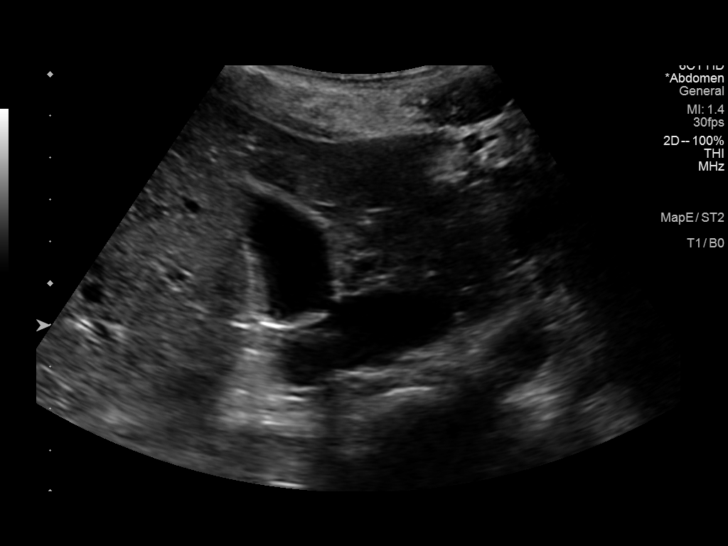
[im 16/93]
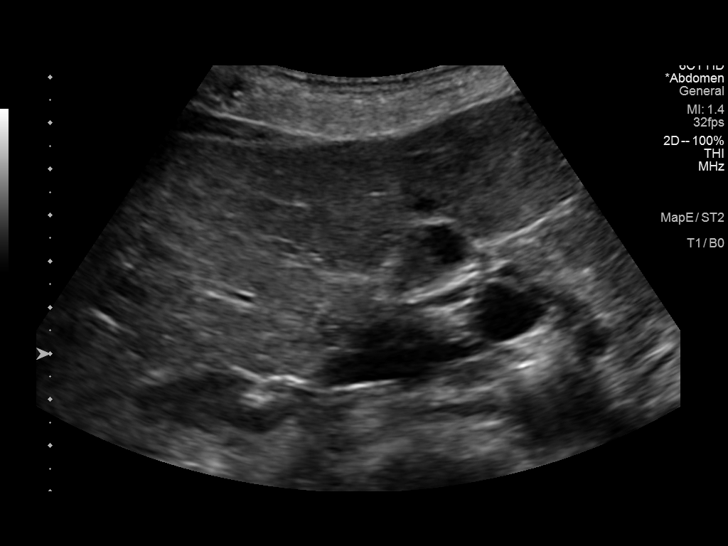
[im 24/93]
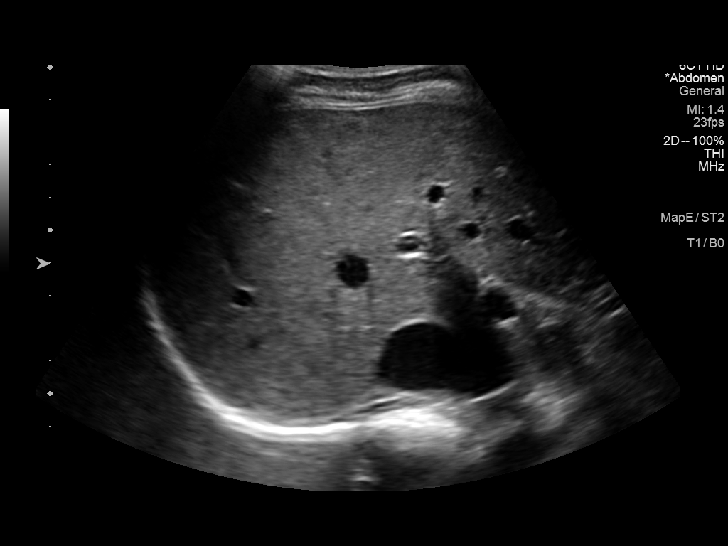
[im 31/93]
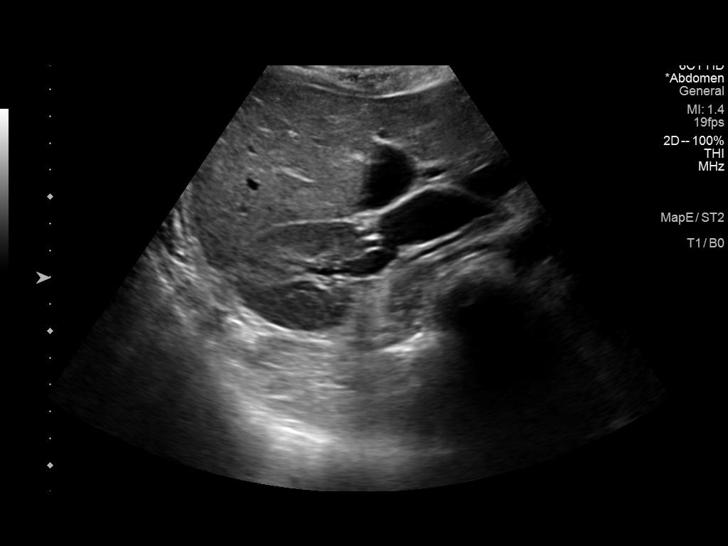
[im 35/93]
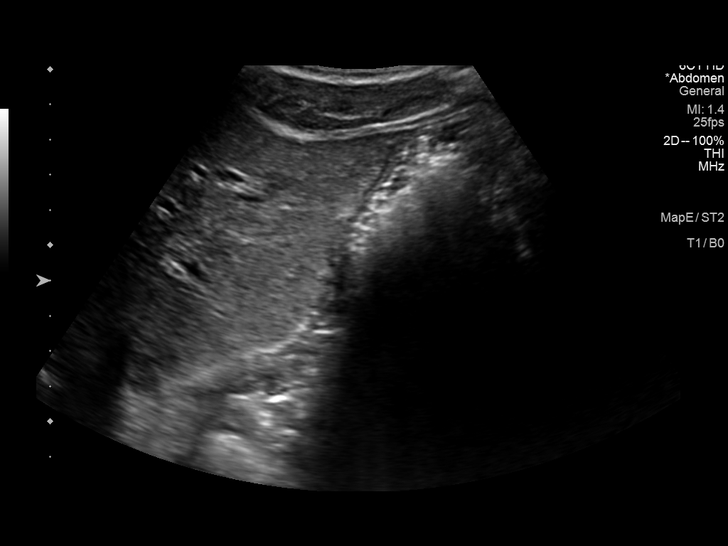
[im 43/93]
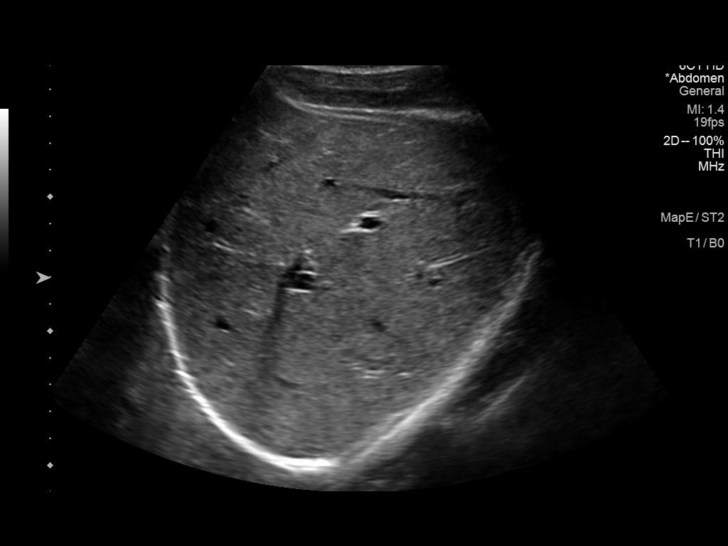
[im 50/93]
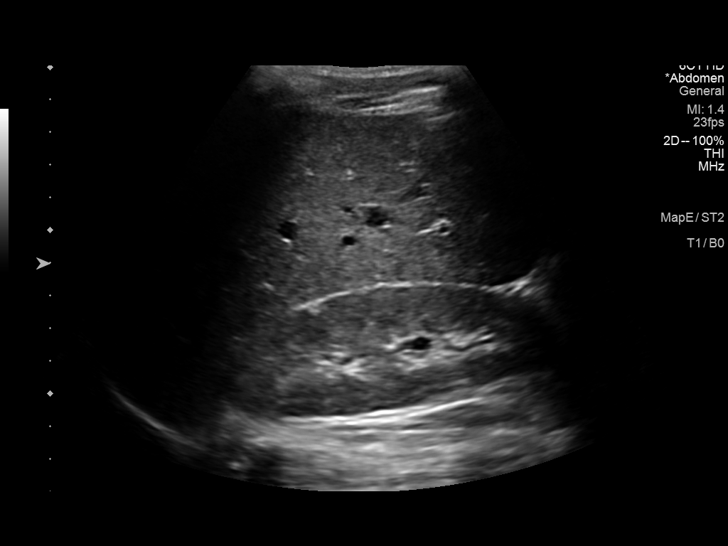
[im 58/93]
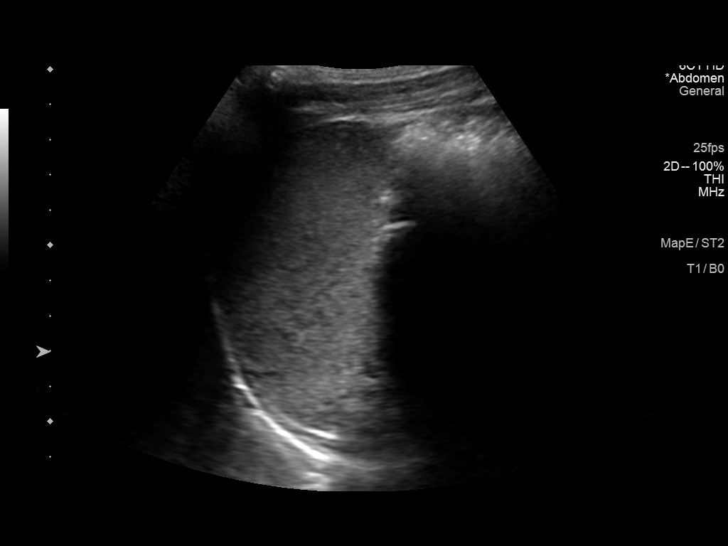
[im 62/93]
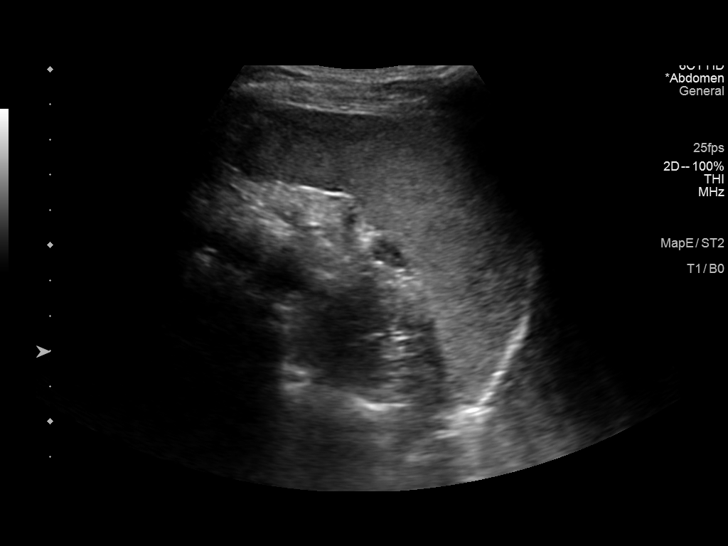
[im 70/93]
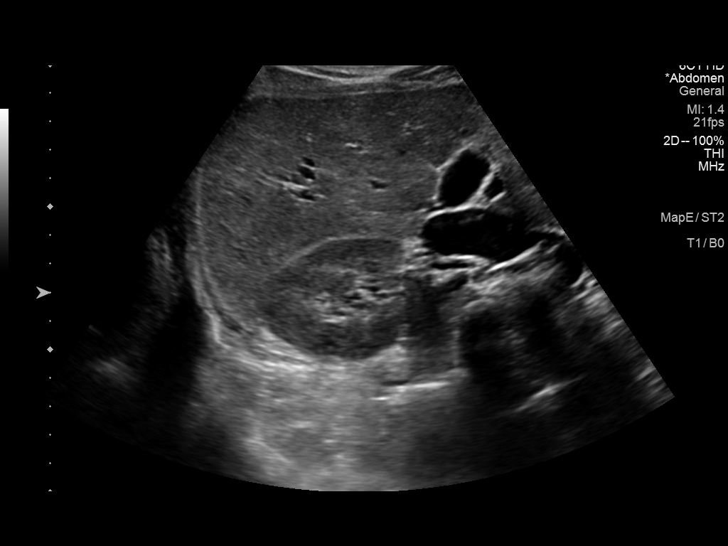
[im 77/93]
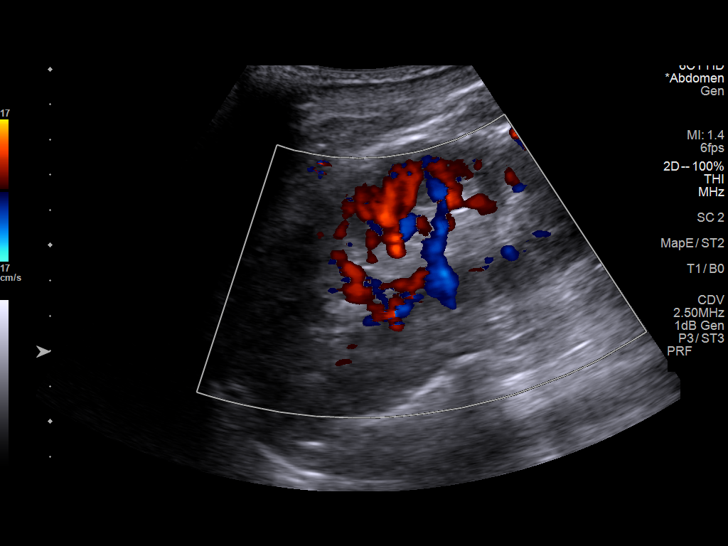
[im 85/93]
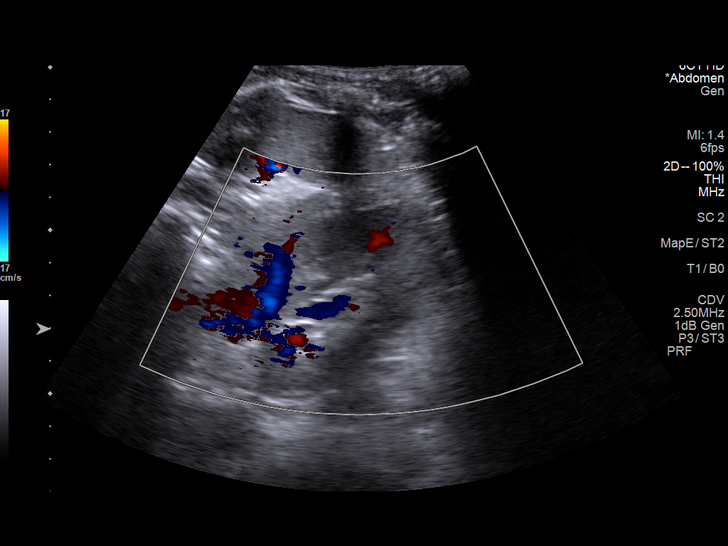
[im 93/93]
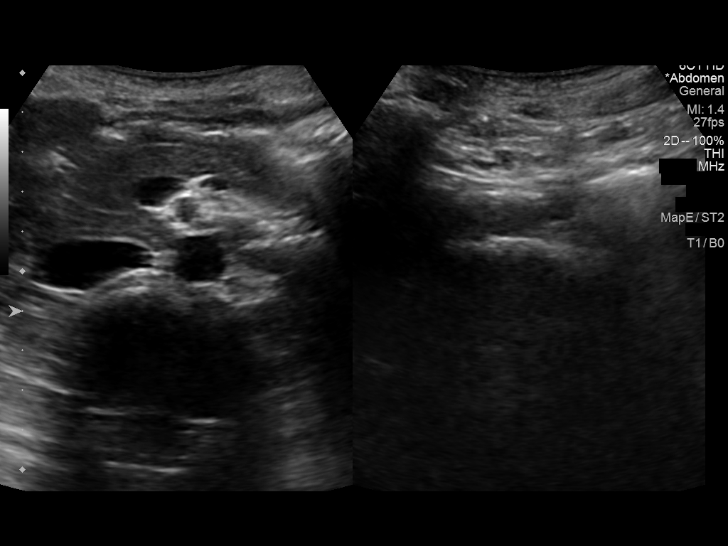

[14 of 25 positions shown; findings below may reference images not displayed]

FINDINGS: Gallbladder: No gallstones or wall thickening visualized. No
sonographic Murphy sign noted by sonographer.

Common bile duct: Diameter: 2 mm

Liver: No focal lesion identified. Within normal limits in
parenchymal echogenicity. Portal vein is patent on color Doppler
imaging with normal direction of blood flow towards the liver.

IVC: No abnormality visualized.

Pancreas: Visualized portion unremarkable.

Spleen: Size and appearance within normal limits.

Right Kidney: Length: 11.4 cm. Echogenicity within normal limits. No
mass or hydronephrosis visualized.

Left Kidney: Length: 11.1 cm. Echogenicity within normal limits. No
mass or hydronephrosis visualized.

Abdominal aorta: No aneurysm visualized.

Other findings: None.
IMPRESSION: Normal examination

## 2021-04-17 IMAGING — DX DG HIP (WITH OR WITHOUT PELVIS) 2-3V*R*
3 series · 3 of 3 positions shown · non-contrast
Comparison: None.

CLINICAL DATA: Pain

EXAM:
DG PELVIS AND BILATERAL HIPS: 4+ VIEWS

[pelvis ap]
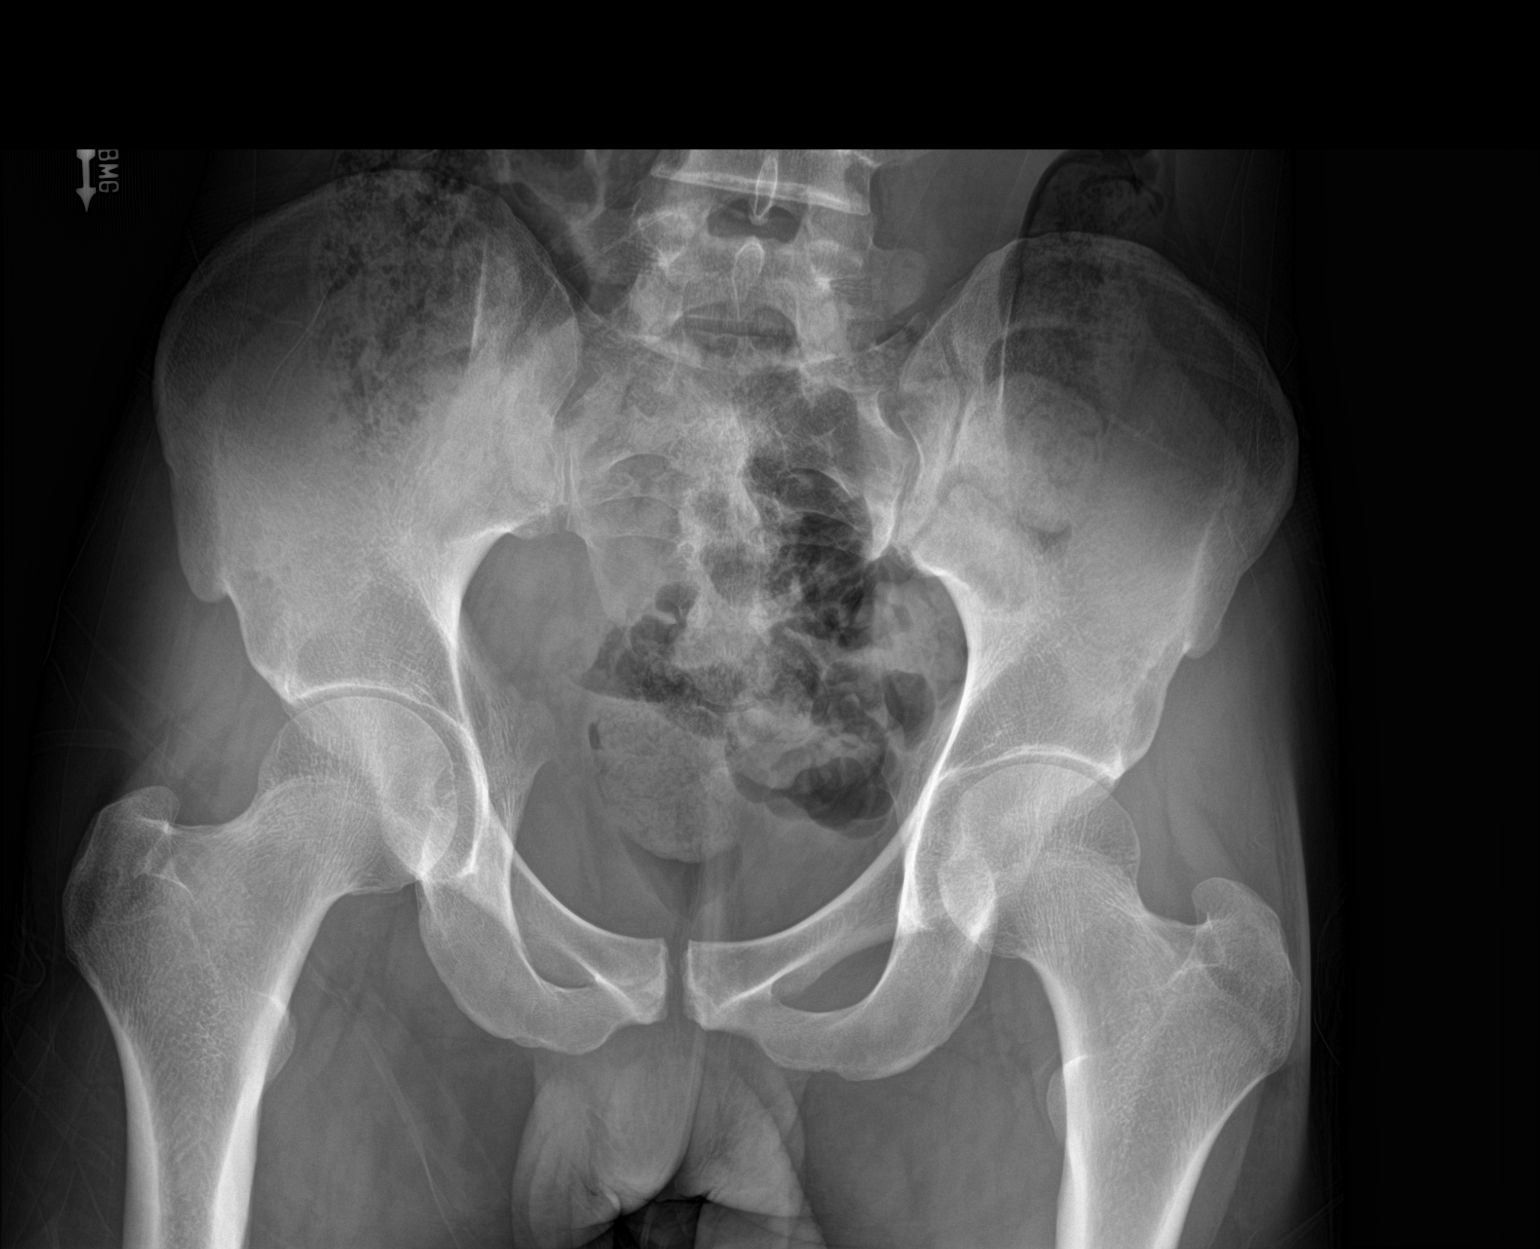

[hip ap]
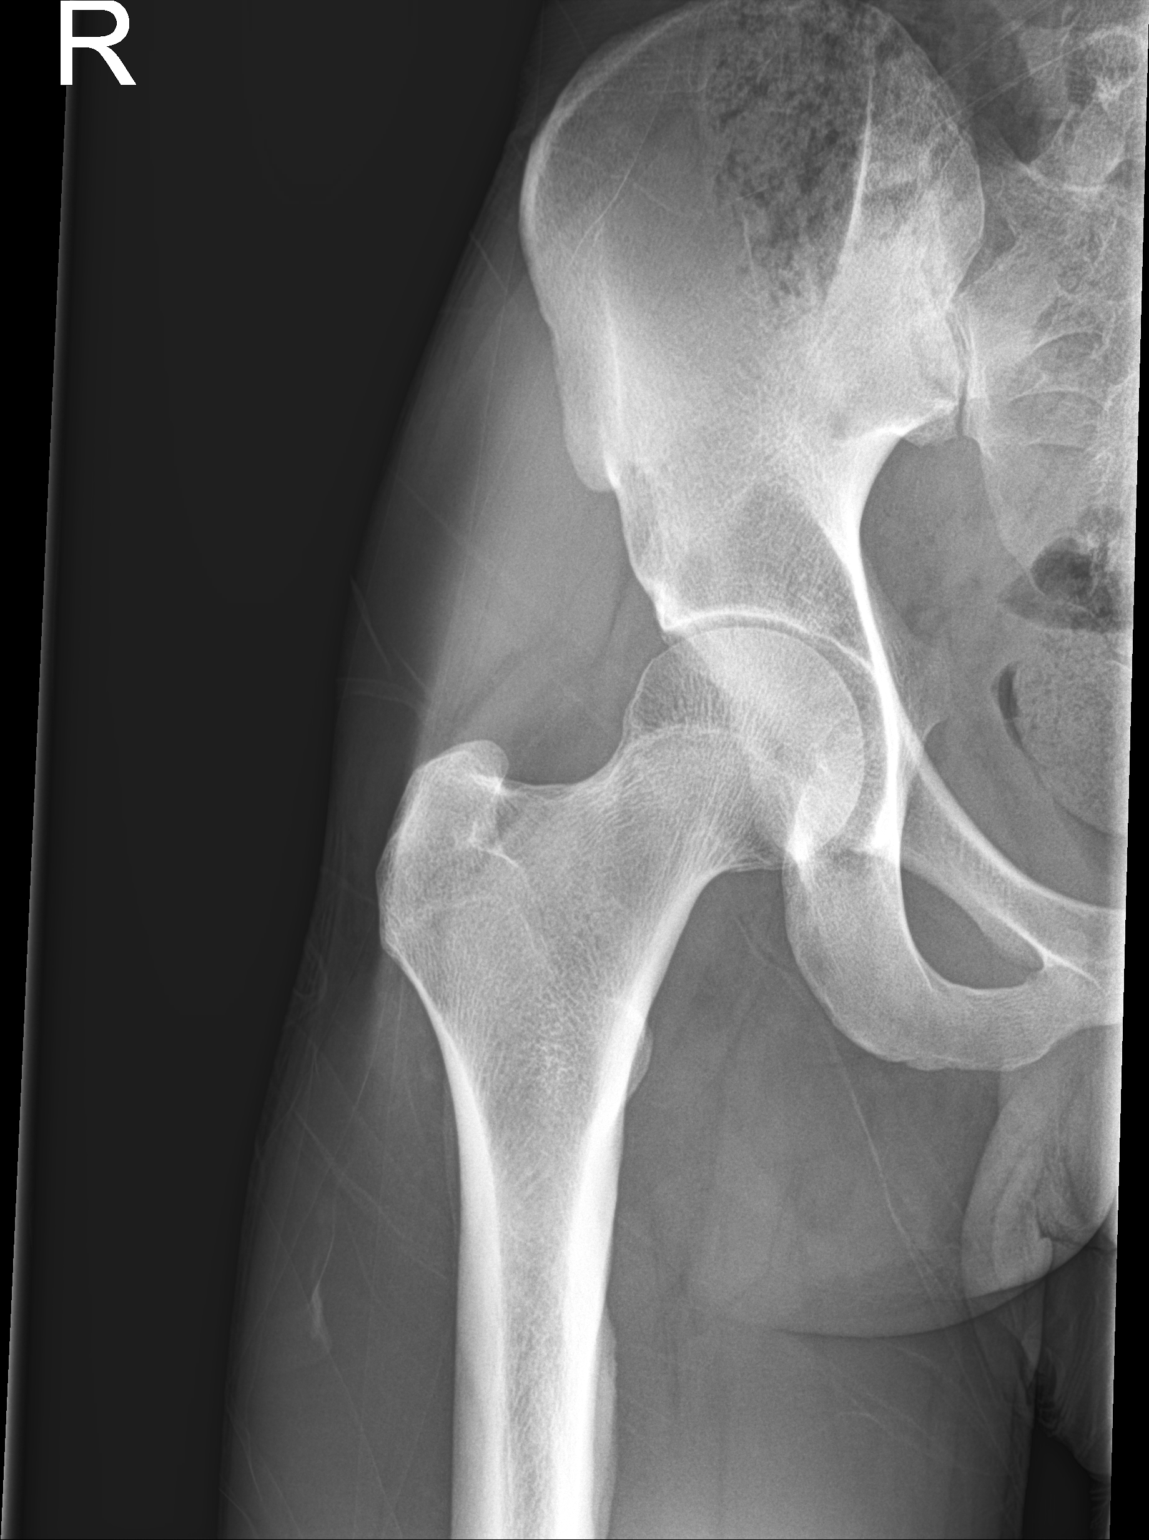

[hip lat]
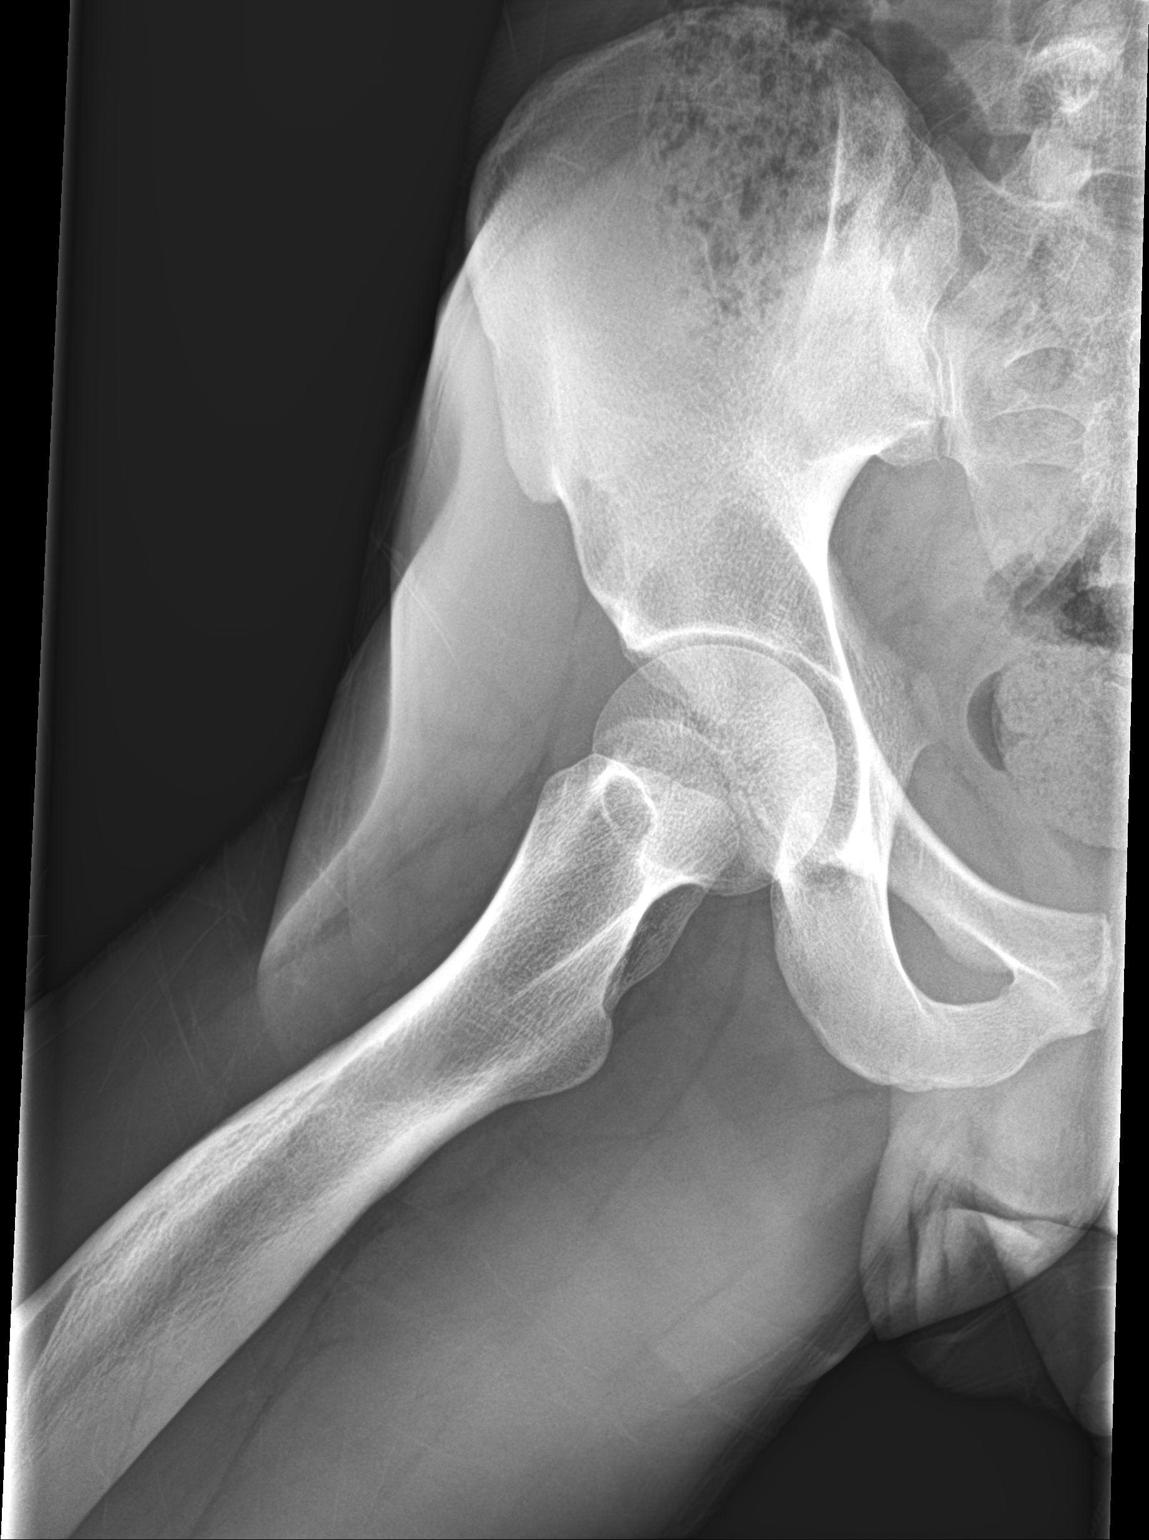

[3 of 3 positions shown; findings below may reference images not displayed]

FINDINGS: Frontal pelvis as well as frontal and lateral views of each
hip-total 5-views obtained. There is no appreciable fracture or
dislocation. There is slight symmetric narrowing of each hip joint.
No erosive change. Sacroiliac joints appear normal bilaterally.
IMPRESSION: Slight symmetric narrowing each hip joint. No fracture or
dislocation.

## 2021-11-09 ENCOUNTER — Other Ambulatory Visit: Payer: Self-pay | Admitting: Internal Medicine

## 2021-11-09 ENCOUNTER — Other Ambulatory Visit: Payer: Self-pay | Admitting: Adult Health

## 2021-11-09 DIAGNOSIS — Z8619 Personal history of other infectious and parasitic diseases: Secondary | ICD-10-CM

## 2021-11-09 DIAGNOSIS — R198 Other specified symptoms and signs involving the digestive system and abdomen: Secondary | ICD-10-CM

## 2021-11-16 ENCOUNTER — Ambulatory Visit
Admission: RE | Admit: 2021-11-16 | Discharge: 2021-11-16 | Disposition: A | Discharge status: Home / Self Care | Payer: Commercial Managed Care - HMO | Source: Ambulatory Visit | Attending: Internal Medicine | Admitting: Internal Medicine

## 2021-11-16 DIAGNOSIS — Z8619 Personal history of other infectious and parasitic diseases: Secondary | ICD-10-CM

## 2021-11-16 DIAGNOSIS — R198 Other specified symptoms and signs involving the digestive system and abdomen: Secondary | ICD-10-CM
